# Patient Record
Sex: Female | Born: 1965 | Race: White | Hispanic: No | Marital: Single | Smoking: Light tobacco smoker
Health system: Southern US, Community
[De-identification: ages and names within clinical notes are randomized; demographics above are authoritative.]

## PROBLEM LIST (undated history)

## (undated) DIAGNOSIS — I639 Cerebral infarction, unspecified: Secondary | ICD-10-CM

## (undated) DIAGNOSIS — I10 Essential (primary) hypertension: Secondary | ICD-10-CM

---

## 1998-12-01 ENCOUNTER — Emergency Department (HOSPITAL_COMMUNITY): Admission: EM | Admit: 1998-12-01 | Discharge: 1998-12-01 | Payer: Self-pay | Admitting: Emergency Medicine

## 1998-12-02 ENCOUNTER — Inpatient Hospital Stay (HOSPITAL_COMMUNITY): Admission: EM | Admit: 1998-12-02 | Discharge: 1998-12-05 | Payer: Self-pay | Admitting: Emergency Medicine

## 2005-02-26 ENCOUNTER — Emergency Department (HOSPITAL_COMMUNITY): Admission: EM | Admit: 2005-02-26 | Discharge: 2005-02-26 | Payer: Self-pay | Admitting: Emergency Medicine

## 2005-02-28 ENCOUNTER — Emergency Department (HOSPITAL_COMMUNITY): Admission: EM | Admit: 2005-02-28 | Discharge: 2005-02-28 | Payer: Self-pay | Admitting: Emergency Medicine

## 2006-06-22 ENCOUNTER — Emergency Department (HOSPITAL_COMMUNITY): Admission: EM | Admit: 2006-06-22 | Discharge: 2006-06-22 | Payer: Self-pay | Admitting: Emergency Medicine

## 2007-02-24 ENCOUNTER — Emergency Department (HOSPITAL_COMMUNITY): Admission: EM | Admit: 2007-02-24 | Discharge: 2007-02-25 | Payer: Self-pay | Admitting: Emergency Medicine

## 2008-05-30 ENCOUNTER — Ambulatory Visit: Payer: Self-pay | Admitting: Gastroenterology

## 2008-06-14 ENCOUNTER — Ambulatory Visit (HOSPITAL_BASED_OUTPATIENT_CLINIC_OR_DEPARTMENT_OTHER): Admission: RE | Admit: 2008-06-14 | Discharge: 2008-06-14 | Payer: Self-pay | Admitting: Orthopedic Surgery

## 2008-07-08 ENCOUNTER — Ambulatory Visit (HOSPITAL_BASED_OUTPATIENT_CLINIC_OR_DEPARTMENT_OTHER): Admission: RE | Admit: 2008-07-08 | Discharge: 2008-07-08 | Payer: Self-pay | Admitting: Orthopedic Surgery

## 2010-07-10 NOTE — Op Note (Signed)
[name], Simpson              ACCOUNT NO.:  [id number]   MEDICAL RECORD NO.:  [id number]          PATIENT TYPE:  AMB   LOCATION:  DSC                          FACILITY:  [facility]   PHYSICIAN:  Katy Fitch. Sypher, M.D. DATE OF BIRTH:  1965-09-16   DATE OF PROCEDURE:  06/14/2008  DATE OF DISCHARGE:                               OPERATIVE REPORT   PREOPERATIVE DIAGNOSES:  1. 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  2. 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   POSTOPERATIVE DIAGNOSES:  1. 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  2. 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   OPERATIONS:  1. Release of left transverse carpal ligament.  2. Decompression of canal of Guyon by release of pisohamate ligament      and release of fascia deep to palmaris brevis muscle with      neurolysis of ulnar nerve across the canal of Guyon.   OPERATING SURGEON:  Katy Fitch. Sypher, MD   ASSISTANT:  None.   ANESTHESIA:  General by LMA.   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 Evelene Croon of [facility] for  evaluation and management of hand numbness.  Clinical examination by Dr.  Lacie Scotts revealed probable carpal tunnel syndrome.  Electrodiagnostic  studies were completed by a neurologist in [place], revealing  evidence of bilateral carpal tunnel syndrome, left greater than right  and left ulnar neuropathy at the level of the wrist.  In addition, [name] had symptoms of numbness proximally in the arm.  She was  evaluated with a cervical MRI.  The cervical MRI did not reveal any  evidence of cord or significant root compression.   Heather Simpson was referred for hand surgery consult due to a failure to  respond to nonoperative measures.  After informed consent at our office,  she is brought to the operating at this time anticipating  decompression  of her left median nerve at the carpal canal and her left ulnar nerve to  the canal of Guyon.   She understands that this will be performed through two incision  technique with release of the transverse carpal ligament and  decompression of the fascial structures surrounding the ulnar nerve.  After informed consent, she is brought to the operating room at this  time.   PROCEDURE IN DETAIL:  Heather Simpson is brought to the operating room  and placed in supine position upon the operating table.   Following the induction of general anesthesia under Dr. Marlane Mingle  direct supervision, the left arm was prepped with Betadine soap and  solution and sterilely draped.  A pneumatic tourniquet was applied to  the proximal left brachium.   Following exsanguination of left arm with Esmarch bandage, arterial  tourniquet was inflated to 230 mmHg.  The procedure commenced with a  short incision in the line of the ring finger palm.  Subcutaneous  tissues were carefully divided revealing the palmar fascia.  This was  carefully separated from the  canal of Guyon and the palmar fascia  overlying the nerve released to allow visualization of the common  sensory branches to the level of the transverse carpal ligament.  The  median nerve proper was separated from the transverse carpal ligament  with a Penfield 4 elevator followed by release of the transverse carpal  ligament with scissors extending into the distal forearm.  This widely  opened the carpal canal.  No masses or predicaments were noted.  Bleeding points were electrocauterized with bipolar current.   The fascia deep to the palmaris brevis muscle was then released with  careful retraction of the muscle.  Care was taken to preserve all  sensory branches of the palm.  The ulnar artery and the sensory branch  of the ulnar nerve root were decompressed proximally followed by a  second incision at the volar aspect of the wrist that  allowed exposure  of the flexor carpi ulnaris.  The fascia along the radial border of  flexor carpi ulnaris was decompressed allowing visualization of the  ulnar nerve and artery.  The fascia overlying the ulnar artery and nerve  was released extending through the first, second, and third zones of the  canal of Guyon.  Care was taken to directly visualize the nerve during  decompression.   Bleeding points were examined and electrocauterized.   Ultimately, a Penfield 4 elevator was passed along the canal of Guyon  assuring complete decompression.  There were no apparent complications.  No masses or osteophytes on the pisotriquetral joint were identified.   The wounds were then repaired with intradermal 3-0 Prolene.  Lidocaine  2% was infiltrated with postop analgesia.  Heather Simpson was placed in  compressive dressing with volar plaster splint maintaining the wrist in  5 degrees of dorsiflexion.   For aftercare, she is provided a prescription for Percocet 5 mg.  She is  advised to take one p.o. q.4-6 h. p.r.n. pain.  She is provided 24  tablets without refill.      Katy Fitch Sypher, M.D.  Electronically Signed     RVS/MEDQ  D:  06/14/2008  T:  06/14/2008  Job:  [id number]   cc:   Evelene Croon, M.D.

## 2010-07-10 NOTE — Op Note (Signed)
NAMESONNET, RIZOR              ACCOUNT NO.:  [id number]   MEDICAL RECORD NO.:  [id number]          PATIENT TYPE:  AMB   LOCATION:  DSC                          FACILITY:  [facility]   PHYSICIAN:  Katy Fitch. Sypher, M.D. DATE OF BIRTH:  Jul 03, 1965   DATE OF PROCEDURE:  07/08/2008  DATE OF DISCHARGE:                               OPERATIVE REPORT   PREOPERATIVE DIAGNOSIS:  Chronic entrapped neuropathy, right median  nerve, right carpal tunnel.   POSTOPERATIVE DIAGNOSIS:  Chronic entrapped neuropathy, right median  nerve, right carpal tunnel.   OPERATIONS:  Release of right transverse carpal ligament.   OPERATING SURGEON:  Katy Fitch. Sypher, MD   ASSISTANT:  Marveen Reeks Dasnoit, PA-C   ANESTHESIA:  General by LMA.   SUPERVISING ANESTHESIOLOGIST:  Dr. Jean Rosenthal.   INDICATIONS:  Heather Simpson is a 45 year old woman referred through  the courtesy of Dr. Evelene Croon of [place], [place] for  evaluation and management of hand numbness.  She was noted to have  moderately severe bilateral carpal tunnel syndrome.  Electrodiagnostic  studies confirmed bilateral median neuropathy.   She is status post release of her left transverse carpal ligament with a  very satisfactory outcome.  She now returns for identical surgery on the  right.   After an informed consent, she is brought to the operating room at this  time.   PROCEDURE:  [name] was brought to the operating room and placed  in supine position upon the operating table.   Following the induction of general anesthesia by LMA technique, the  right arm was prepped with Betadine soap and solution and sterilely  draped.  A pneumatic tourniquet was applied to the proximal right  brachium.   On exsanguination of the right arm with Esmarch bandage, arterial  tourniquet was inflated to 200 and 30 mmHg.  The procedure commenced  with a short incision in the line of the ring finger and the palm.  Subcutaneous tissues were  carefully divided revealing the palmar fascia.  This split longitudinally to the common sensory branch of the median  nerve.  These were followed back to the median nerve proper.  Median  nerve was then gently isolated from the transverse carpal ligament.  The  ligament was released with scissors along its ulnar border extending  into the distal forearm.   This widely opened the carpal canal.  No mass or other pigments were  noted.  Bleeding was not problematic.  The wound was then repaired with  intradermal 3-0 Prolene.  A compressive dressing was applied with a  volar plaster splint maintaining the wrist in 5 degrees of dorsiflexion.   For aftercare, [name] is advised to elevate her hand.  To work on  finger range of motion exercises.  We will see her back in followup in 7  days for dressing change and suture removal.       Katy Fitch. Sypher, M.D.  Electronically Signed     RVS/MEDQ  D:  07/08/2008  T:  07/08/2008  Job:  [id number]   cc:   Evelene Croon

## 2013-02-25 HISTORY — PX: BREAST BIOPSY: SHX20

## 2016-05-21 ENCOUNTER — Encounter: Payer: Self-pay | Admitting: Emergency Medicine

## 2016-05-21 DIAGNOSIS — F10129 Alcohol abuse with intoxication, unspecified: Secondary | ICD-10-CM | POA: Insufficient documentation

## 2016-05-21 NOTE — ED Triage Notes (Signed)
Pt was at homeless shelter and they told she smelled like alcohol and needed to come here for detox. Pt is from [place] visiting someone and doesn't have a place to go. Pt denies any drug use, has had 4 beer and tequila tonight.

## 2016-05-22 ENCOUNTER — Emergency Department
Admission: EM | Admit: 2016-05-22 | Discharge: 2016-05-22 | Disposition: A | Discharge status: Home / Self Care | Payer: Self-pay | Attending: Emergency Medicine | Admitting: Emergency Medicine

## 2016-05-22 DIAGNOSIS — F10920 Alcohol use, unspecified with intoxication, uncomplicated: Secondary | ICD-10-CM

## 2016-05-22 NOTE — ED Provider Notes (Signed)
[facility] Emergency Department Provider Note _   First MD Initiated Contact with Patient 05/22/16 0330     (approximate)  I have reviewed the triage vital signs and the nursing notes.  History Limited secondary to alcohol intoxication HISTORY  Chief Complaint Addiction Problem   HPI Heather Simpson is a 51 y.o. female presents stating that she was not allowed into the homeless shelter secondary to alcohol intoxication and as such did not have anywhere to go. Patient admits to drinking multiple beers and tequila tonight. Patient denies any suicidal homicidal ideation.   Past medical history None There are no active problems to display for this patient.   Past surgical history None  Prior to Admission medications   Not on File    Allergies No known drug allergies No family history on file.  Social History Social History  Substance Use Topics  . Smoking status: Not on file  . Smokeless tobacco: Not on file  . Alcohol use Not on file    Review of Systems Constitutional: No fever/chills Eyes: No visual changes. ENT: No sore throat. Cardiovascular: Denies chest pain. Respiratory: Denies shortness of breath. Gastrointestinal: No abdominal pain.  No nausea, no vomiting.  No diarrhea.  No constipation. Genitourinary: Negative for dysuria. Musculoskeletal: Negative for back pain. Skin: Negative for rash. Neurological: Negative for headaches, focal weakness or numbness.  10-point ROS otherwise negative.  ____________________________________________   PHYSICAL EXAM:  VITAL SIGNS: ED Triage Vitals  Enc Vitals Group     BP 05/21/16 2324 127/81     Pulse Rate 05/21/16 2324 97     Resp 05/21/16 2324 18     Temp 05/21/16 2324 97.9 F (36.6 C)     Temp Source 05/21/16 2324 Oral     SpO2 05/21/16 2324 100 %     Weight 05/21/16 2324 130 lb (59 kg)     Height 05/21/16 2324 5\' 2"  (1.575 m)     Head Circumference --      Peak Flow --        Pain Score 05/22/16 0357 0     Pain Loc --      Pain Edu? --      Excl. in [place]? --     Constitutional: Alert and oriented. Appears intoxicated Eyes: Conjunctivae are normal. PERRL. EOMI. Head: Atraumatic. Mouth/Throat: Mucous membranes are moist.  Oropharynx non-erythematous. Neck: No stridor.   Cardiovascular: Normal rate, regular rhythm. Good peripheral circulation. Grossly normal heart sounds. Respiratory: Normal respiratory effort.  No retractions. Lungs CTAB. Gastrointestinal: Soft and nontender. No distention.   Musculoskeletal: No lower extremity tenderness nor edema. No gross deformities of extremities. Neurologic:  Normal speech and language. No gross focal neurologic deficits are appreciated.  Skin:  Skin is warm, dry and intact. No rash noted.    Procedures   ____________________________________________   INITIAL IMPRESSION / ASSESSMENT AND PLAN / ED COURSE  Pertinent labs & imaging results that were available during my care of the patient were reviewed by me and considered in my medical decision making (see chart for details).        ____________________________________________  FINAL CLINICAL IMPRESSION(S) / ED DIAGNOSES  Final diagnoses:  Alcoholic intoxication without complication ([place])     MEDICATIONS GIVEN DURING THIS VISIT:  Medications - No data to display   NEW OUTPATIENT MEDICATIONS STARTED DURING THIS VISIT:  New Prescriptions   No medications on file    Modified Medications   No medications on file  Discontinued Medications   No medications on file     Note:  This document was prepared using Dragon voice recognition software and may include unintentional dictation errors.    Gregor Hams, MD 05/22/16 [phone]

## 2016-05-22 NOTE — ED Notes (Addendum)
Pt denies any pain or medical c/o at this time. Dr Owens Shark at bedside; pt informed him that she was told she could not stay at the homeless shelter tonight d/t ETOH intoxication and needed a place to stay. Per Dr Owens Shark, ok to stay in [facility] until needed as ED census dictates.

## 2016-05-22 NOTE — ED Notes (Addendum)
Pt discharged home after verbalizing understanding of discharge instructions; nad noted.  Pt given food tray and bus pass. Denies any needs beyond these.

## 2016-07-19 ENCOUNTER — Emergency Department
Admission: EM | Admit: 2016-07-19 | Discharge: 2016-07-19 | Disposition: A | Discharge status: Home / Self Care | Payer: Self-pay | Attending: Emergency Medicine | Admitting: Emergency Medicine

## 2016-07-19 ENCOUNTER — Emergency Department: Payer: Self-pay

## 2016-07-19 DIAGNOSIS — R202 Paresthesia of skin: Secondary | ICD-10-CM | POA: Insufficient documentation

## 2016-07-19 DIAGNOSIS — Z8673 Personal history of transient ischemic attack (TIA), and cerebral infarction without residual deficits: Secondary | ICD-10-CM | POA: Insufficient documentation

## 2016-07-19 LAB — TROPONIN I: Troponin I: <0.03 ng/mL (ref <0.03)

## 2016-07-19 LAB — COMPREHENSIVE METABOLIC PANEL
ALBUMIN: 3.8 g/dL (ref 3.5–5.0)
ALK PHOS: 47 U/L (ref 38–126)
ALT: 14 U/L (ref 14–54)
ANION GAP: 4 — AB (ref 5–15)
AST: 18 U/L (ref 15–41)
BUN: 6 mg/dL (ref 6–20)
CHLORIDE: 113 mmol/L — AB (ref 101–111)
CO2: 23 mmol/L (ref 22–32)
CREATININE: 0.57 mg/dL (ref 0.44–1.00)
Calcium: 8.7 mg/dL — ABNORMAL LOW (ref 8.9–10.3)
GFR calc non Af Amer: >60 mL/min (ref >60)
GLUCOSE: 85 mg/dL (ref 65–99)
Potassium: 3.4 mmol/L — ABNORMAL LOW (ref 3.5–5.1)
SODIUM: 140 mmol/L (ref 135–145)
Total Bilirubin: 0.2 mg/dL — ABNORMAL LOW (ref 0.3–1.2)
Total Protein: 7.2 g/dL (ref 6.5–8.1)

## 2016-07-19 LAB — CBC
HCT: 40.5 % (ref 35.0–47.0)
HEMOGLOBIN: 14.1 g/dL (ref 12.0–16.0)
MCH: 33.8 pg (ref 26.0–34.0)
MCHC: 34.9 g/dL (ref 32.0–36.0)
MCV: 96.7 fL (ref 80.0–100.0)
PLATELETS: 298 10*3/uL (ref 150–440)
RBC: 4.19 MIL/uL (ref 3.80–5.20)
RDW: 14 % (ref 11.5–14.5)
WBC: 10.2 10*3/uL (ref 3.6–11.0)

## 2016-07-19 NOTE — ED Triage Notes (Addendum)
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 States here because has never had symptoms to right side. Pt also co left sided neck pain but states has had symptoms for years.

## 2016-07-19 NOTE — ED Provider Notes (Signed)
[facility] Emergency Department Provider Note  Time seen: 5:41 AM  I have reviewed the triage vital signs and the nursing notes.   HISTORY  Chief Complaint Tingling    HPI Heather Simpson is a 51 y.o. female with a past medical history of TIAs who presents the emergency department with right-sided tingling. According to the patient for the past several days she has been experiencing a tingling sensation at times in her leg, at times in her right flank and at times in her right arm. Patient has a history of TIAs in the past although she states they mostly affected the left side. Denies any tingling currently. Denies any weakness or numbness at any point. Denies any confusion, slurred speech or headache. Also states a history of peripheral neuropathy but also states that typically affects her left side.  No past medical history on file.  There are no active problems to display for this patient.   No past surgical history on file.  Prior to Admission medications   Not on File    No Known Allergies  No family history on file.  Social History Social History  Substance Use Topics  . Smoking status: Not on file  . Smokeless tobacco: Not on file  . Alcohol use Not on file    Review of Systems Constitutional: Negative for fever. Cardiovascular: Negative for chest pain. Respiratory: Negative for shortness of breath. Gastrointestinal: Negative for abdominal pain Genitourinary: Negative for dysuria. Musculoskeletal: Mild neck pain. Skin: Negative for rash. Neurological: Negative for headache. Negative for weakness or numbness. Positive for tingling over various parts of the right side of her body over the last few days. All other ROS negative  ____________________________________________   PHYSICAL EXAM:  VITAL SIGNS: ED Triage Vitals  Enc Vitals Group     BP 07/19/16 0210 (!) 115/95     Pulse Rate 07/19/16 0210 99     Resp 07/19/16 0210 16     Temp 07/19/16 0210 98.7 F (37.1 C)     Temp Source 07/19/16 0210 Oral     SpO2 07/19/16 0210 98 %     Weight 07/19/16 0211 130 lb (59 kg)     Height 07/19/16 0211 5\' 2"  (1.575 m)     Head Circumference --      Peak Flow --      Pain Score 07/19/16 0215 8     Pain Loc --      Pain Edu? --      Excl. in [place]? --     Constitutional: Alert and oriented. Well appearing and in no distress. Eyes: Normal exam ENT   Head: Normocephalic and atraumatic.   Mouth/Throat: Mucous membranes are moist. Cardiovascular: Normal rate, regular rhythm. No murmur Respiratory: Normal respiratory effort without tachypnea nor retractions. Breath sounds are clear  Gastrointestinal: Soft and nontender. No distention.   Musculoskeletal: Nontender with normal range of motion in all extremities.  Neurologic:  Normal speech and language. No gross focal neurologic deficits. Equal grip strength. No pronator drift. 5/5 motor in all extremities. No cranial nerve deficits. Skin:  Skin is warm, dry and intact.  Psychiatric: Mood and affect are normal.   ____________________________________________    EKG  EKG reviewed and interpreted by myself shows normal sinus rhythm at 89 bpm, narrow QRS, normal axis, normal intervals, no ST changes. Normal EKG.  ____________________________________________    RADIOLOGY  CT head negative  ____________________________________________   INITIAL IMPRESSION / ASSESSMENT AND PLAN / ED  COURSE  Pertinent labs & imaging results that were available during my care of the patient were reviewed by me and considered in my medical decision making (see chart for details).  Patient presents to the emergency department for intermittent paresthesias of the right side of her body over the past several days. Currently the patient appears well denies any paresthesias or tingling at this time. She has a normal exam including neurological exam. Patient's lab work is reassuring. EKG is  normal. CT head is normal. As the patient is asymptomatic/symptom free at this time, I believe the patient is safe for discharge home with PCP follow-up. The patient is agreeable to this plan.  ____________________________________________   FINAL CLINICAL IMPRESSION(S) / ED DIAGNOSES  Paresthesias    Harvest Dark, MD 07/19/16 [phone]

## 2016-07-19 NOTE — ED Notes (Signed)

## 2017-06-03 ENCOUNTER — Other Ambulatory Visit: Payer: Self-pay | Admitting: Family Medicine

## 2017-06-03 DIAGNOSIS — Z1231 Encounter for screening mammogram for malignant neoplasm of breast: Secondary | ICD-10-CM

## 2017-07-11 ENCOUNTER — Ambulatory Visit
Admission: RE | Admit: 2017-07-11 | Discharge: 2017-07-11 | Disposition: A | Discharge status: Home / Self Care | Payer: BLUE CROSS/BLUE SHIELD | Source: Ambulatory Visit | Attending: Family Medicine | Admitting: Family Medicine

## 2017-07-11 DIAGNOSIS — Z1231 Encounter for screening mammogram for malignant neoplasm of breast: Secondary | ICD-10-CM | POA: Insufficient documentation

## 2017-07-17 ENCOUNTER — Other Ambulatory Visit: Payer: Self-pay | Admitting: *Deleted

## 2017-07-17 ENCOUNTER — Inpatient Hospital Stay
Admission: RE | Admit: 2017-07-17 | Discharge: 2017-07-17 | Disposition: A | Discharge status: Home / Self Care | Payer: Self-pay | Source: Ambulatory Visit | Attending: *Deleted | Admitting: *Deleted

## 2017-07-17 DIAGNOSIS — Z9289 Personal history of other medical treatment: Secondary | ICD-10-CM

## 2017-07-24 ENCOUNTER — Other Ambulatory Visit: Payer: Self-pay | Admitting: Family Medicine

## 2017-07-24 DIAGNOSIS — N6310 Unspecified lump in the right breast, unspecified quadrant: Secondary | ICD-10-CM

## 2017-07-24 DIAGNOSIS — R928 Other abnormal and inconclusive findings on diagnostic imaging of breast: Secondary | ICD-10-CM

## 2017-07-31 ENCOUNTER — Ambulatory Visit
Admission: RE | Admit: 2017-07-31 | Discharge: 2017-07-31 | Disposition: A | Discharge status: Home / Self Care | Payer: BLUE CROSS/BLUE SHIELD | Source: Ambulatory Visit | Attending: Family Medicine | Admitting: Family Medicine

## 2017-07-31 DIAGNOSIS — N6310 Unspecified lump in the right breast, unspecified quadrant: Secondary | ICD-10-CM

## 2017-07-31 DIAGNOSIS — R928 Other abnormal and inconclusive findings on diagnostic imaging of breast: Secondary | ICD-10-CM

## 2018-06-03 ENCOUNTER — Emergency Department: Payer: BLUE CROSS/BLUE SHIELD

## 2018-06-03 ENCOUNTER — Other Ambulatory Visit: Payer: Self-pay

## 2018-06-03 ENCOUNTER — Encounter: Payer: Self-pay | Admitting: Emergency Medicine

## 2018-06-03 ENCOUNTER — Emergency Department
Admission: EM | Admit: 2018-06-03 | Discharge: 2018-06-04 | Disposition: A | Discharge status: Home / Self Care | Payer: BLUE CROSS/BLUE SHIELD | Attending: Emergency Medicine | Admitting: Emergency Medicine

## 2018-06-03 DIAGNOSIS — Z8673 Personal history of transient ischemic attack (TIA), and cerebral infarction without residual deficits: Secondary | ICD-10-CM | POA: Diagnosis not present

## 2018-06-03 DIAGNOSIS — R5383 Other fatigue: Secondary | ICD-10-CM | POA: Diagnosis not present

## 2018-06-03 DIAGNOSIS — R51 Headache: Secondary | ICD-10-CM | POA: Insufficient documentation

## 2018-06-03 DIAGNOSIS — R531 Weakness: Secondary | ICD-10-CM | POA: Diagnosis not present

## 2018-06-03 LAB — COMPREHENSIVE METABOLIC PANEL
ALT: 28 U/L (ref 0–44)
AST: 25 U/L (ref 15–41)
Albumin: 4.1 g/dL (ref 3.5–5.0)
Alkaline Phosphatase: 54 U/L (ref 38–126)
Anion gap: 10 (ref 5–15)
BUN: 15 mg/dL (ref 6–20)
CO2: 26 mmol/L (ref 22–32)
Calcium: 9.4 mg/dL (ref 8.9–10.3)
Chloride: 104 mmol/L (ref 98–111)
Creatinine, Ser: 0.59 mg/dL (ref 0.44–1.00)
GFR calc Af Amer: >60 mL/min (ref >60)
GFR calc non Af Amer: >60 mL/min (ref >60)
Glucose, Bld: 93 mg/dL (ref 70–99)
Potassium: 3.7 mmol/L (ref 3.5–5.1)
Sodium: 140 mmol/L (ref 135–145)
Total Bilirubin: 0.4 mg/dL (ref 0.3–1.2)
Total Protein: 7 g/dL (ref 6.5–8.1)

## 2018-06-03 LAB — URINE DRUG SCREEN, QUALITATIVE (ARMC ONLY)
Amphetamines, Ur Screen: NOT DETECTED
Barbiturates, Ur Screen: NOT DETECTED
Benzodiazepine, Ur Scrn: NOT DETECTED
Cannabinoid 50 Ng, Ur ~~LOC~~: NOT DETECTED
Cocaine Metabolite,Ur ~~LOC~~: NOT DETECTED
MDMA (Ecstasy)Ur Screen: NOT DETECTED
Methadone Scn, Ur: NOT DETECTED
Opiate, Ur Screen: NOT DETECTED
Phencyclidine (PCP) Ur S: NOT DETECTED
Tricyclic, Ur Screen: NOT DETECTED

## 2018-06-03 LAB — DIFFERENTIAL
Abs Immature Granulocytes: 0.04 10*3/uL (ref 0.00–0.07)
Basophils Absolute: 0.1 10*3/uL (ref 0.0–0.1)
Basophils Relative: 1 %
Eosinophils Absolute: 0.3 10*3/uL (ref 0.0–0.5)
Eosinophils Relative: 3 %
Immature Granulocytes: 0 %
Lymphocytes Relative: 28 %
Lymphs Abs: 2.6 10*3/uL (ref 0.7–4.0)
Monocytes Absolute: 1 10*3/uL (ref 0.1–1.0)
Monocytes Relative: 10 %
Neutro Abs: 5.5 10*3/uL (ref 1.7–7.7)
Neutrophils Relative %: 58 %

## 2018-06-03 LAB — CBC
HCT: 41 % (ref 36.0–46.0)
Hemoglobin: 13.7 g/dL (ref 12.0–15.0)
MCH: 33.3 pg (ref 26.0–34.0)
MCHC: 33.4 g/dL (ref 30.0–36.0)
MCV: 99.5 fL (ref 80.0–100.0)
Platelets: 262 10*3/uL (ref 150–400)
RBC: 4.12 MIL/uL (ref 3.87–5.11)
RDW: 13 % (ref 11.5–15.5)
WBC: 9.5 10*3/uL (ref 4.0–10.5)
nRBC: 0 % (ref 0.0–0.2)

## 2018-06-03 LAB — PROTIME-INR
INR: 1 (ref 0.8–1.2)
Prothrombin Time: 13.3 seconds (ref 11.4–15.2)

## 2018-06-03 LAB — APTT: aPTT: 30 seconds (ref 24–36)

## 2018-06-03 LAB — URINALYSIS, ROUTINE W REFLEX MICROSCOPIC
Bilirubin Urine: NEGATIVE
Glucose, UA: NEGATIVE mg/dL
Hgb urine dipstick: NEGATIVE
Ketones, ur: NEGATIVE mg/dL
Leukocytes,Ua: NEGATIVE
Nitrite: NEGATIVE
Protein, ur: NEGATIVE mg/dL
Specific Gravity, Urine: 1.013 (ref 1.005–1.030)
pH: 6 (ref 5.0–8.0)

## 2018-06-03 MED ORDER — FENTANYL CITRATE (PF) 100 MCG/2ML IJ SOLN
12.5000 ug | Freq: Once | INTRAMUSCULAR | Status: AC
  Administered 2018-06-03: 12.5 ug via INTRAVENOUS
  Filled 2018-06-03: qty 2

## 2018-06-03 NOTE — Consult Note (Signed)
   [facility] Tele[facility]urology Consult Services  Impression:  Patient with extensive workup at [facility] as per notes in [place] - work up negative.  Given negative work up and her exam today, suspect a functional process - speech is stuttering/nonroganic and LUE drifts without pronation.  HA can be treated conservatively, as the patient is in no distress and CT today shows nothing acute.  Recommendations:  - conservative HA treatment as per ED - follow up outpatient neurology and [facility] - sign off ---------------------------------------------------------------------  CC: HA  History of Present Illness:  53 year old woman with a history of depression, anxiety, smoking.  Admitted to [facility] on 4/1 with left-sided weakness/sensory loss and speech change.  Given tPA.  Admitted and MRI showed no acute changes and MRA head/neck showed patent vessels.  Patient continued with symptoms of left-sided weakness/numbness, but speech improved.  Was getting [place] therapy since discharge on 4/3.  Notes from [facility] indicate patient likely with functional weakness - Todd's phenomenon considered, but EEG while patient symptomatic was normal.  She reports to the ED with left-sided headache for 2 days.  Speech has been stuttering, but patient unable to reliably tell when this reappeared.  Diagnostic Testing: CT head wo - nothing acute  Vital Signs:   Reviewed in EMR  Exam:  Mental Status:  Awake, alert, oriented.  Intermittent stuttering.  Cranial Nerves:  Pupils: Equal round and reactive to light Extraocular movements: Intact in all cardinal gaze Ptosis: Absent Visual fields: Intact to finger counting Facial sensation: reduced on left Facial movements: Intact and symmetric    Motor Exam:  Drift of LUE without pronation, giveway weakness LLE on sustention   Tremor/Abnormal Movements:  Resting tremor: Absent Intention tremor: Absent Postural tremor: Absent  Sensory Exam:   Decreased LT/PP in left  arm/leg    Coordination:   Finger to nose: Intact Heel to shin: Intact  Medical Decision Making:  - Extensive number of diagnosis or management options are considered above.   - Extensive amount of complex data reviewed.   - High risk of complication and/or morbidity or mortality are associated with differential diagnostic considerations above.  - There may be uncertain outcome and increased probability of prolonged functional impairment or high probability of severe prolonged functional impairment associated with some of these differential diagnosis.   Medical Data Reviewed:  1.Data reviewed include clinical labs, radiology,  Medical Tests;   2.Tests results discussed w/performing or interpreting physician;   3.Obtaining/reviewing old medical records;  4.Obtaining case history from another source;  5.Independent review of image, tracing or specimen.    Patient was informed the Neurology Consult would happen via telehealth (remote video) and consented to receiving care in this manner.

## 2018-06-03 NOTE — Discharge Instructions (Addendum)
Take your medicines daily as directed by your doctor.  Return to the ER for worsening symptoms, persistent vomiting, difficulty breathing or other concerns. 

## 2018-06-03 NOTE — ED Notes (Signed)
Pt returned from CT °

## 2018-06-03 NOTE — ED Notes (Signed)
Dr. Jacqualine Code clarified that he did not want to make the Pt a code stroke, he wants the Pt to have a Neurology consult.

## 2018-06-03 NOTE — ED Notes (Signed)
Pt went to CT

## 2018-06-03 NOTE — ED Triage Notes (Signed)
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 Pt reports that today is her best day up to date following the stroke. Pt states that she occasionally feels a temporary headache accompanied by jerking and a cold sensation on the legs. Pt is AOx4 in no apparent distress.

## 2018-06-03 NOTE — ED Provider Notes (Signed)
[facility] Emergency Department Provider Note   ____________________________________________   First MD Initiated Contact with Patient 06/03/18 1959     (approximate)  I have reviewed the triage vital signs and the nursing notes.   HISTORY  Chief Complaint Altered Mental Status    HPI Heather Simpson is a 53 y.o. female   recently evaluated and treated with TPA for possible stroke at [facility] 1 week ago.  Patient reports that about 4 hours prior to arrival she started notice a bit of a slight headache over the left side is bit throbbing in nature.  No eye pain.  She also notes that today she felt a little bit more weak in her left lower leg but also reports she did some intense physical therapy today.  Physical therapy is seeing and evaluating her in her home daily as she is now in recovery after receiving TPA at [facility] for a possible stroke.  She is on salicylates daily at this time  History reviewed. No pertinent past medical history.  There are no active problems to display for this patient.   Past Surgical History:  Procedure Laterality Date   BREAST BIOPSY Left 2015   benign    Prior to Admission medications   Not on File    Allergies Patient has no known allergies.  Family History  Problem Relation Age of Onset   Breast cancer Neg Hx     Social History Social History   Tobacco Use   Smoking status: Never Smoker   Smokeless tobacco: Never Used  Substance Use Topics   Alcohol use: Never    Frequency: Never   Drug use: Never    Review of Systems Constitutional: No fever/chills but feeling fatigued Eyes: No visual changes. ENT: No sore throat. Cardiovascular: Denies chest pain. Respiratory: Denies shortness of breath. Gastrointestinal: No abdominal pain.   Genitourinary: Negative for dysuria. Musculoskeletal: Negative for back pain. Skin: Negative for rash. Neurological: Negative for slight headache, see HPI.   Some slight weakness noted in the left leg a little more than baseline from her recent stroke and also reports some trouble speaking and is been ongoing since the time of her stroke    ____________________________________________   PHYSICAL EXAM:  VITAL SIGNS: ED Triage Vitals  Enc Vitals Group     BP 06/03/18 2012 (!) 136/98     Pulse Rate 06/03/18 2012 66     Resp 06/03/18 2012 16     Temp 06/03/18 2012 98.3 F (36.8 C)     Temp Source 06/03/18 2012 Oral     SpO2 06/03/18 2012 99 %     Weight 06/03/18 2015 132 lb (59.9 kg)     Height 06/03/18 2015 5\' 2"  (1.575 m)     Head Circumference --      Peak Flow --      Pain Score 06/03/18 2014 7     Pain Loc --      Pain Edu? --      Excl. in [place]? --     Constitutional: Alert and oriented. Well appearing and in no acute distress.  She is very pleasant. Eyes: Conjunctivae are normal. Head: Atraumatic. Nose: No congestion/rhinnorhea. Mouth/Throat: Mucous membranes are moist. Neck: No stridor.  Cardiovascular: Normal rate, regular rhythm. Grossly normal heart sounds.  Good peripheral circulation. Respiratory: Normal respiratory effort.  No retractions. Lungs CTAB. Gastrointestinal: Soft and nontender. No distention. Musculoskeletal: No lower extremity tenderness nor edema. Neurologic:  Normal speech  and language at times, then sometimes seems to have like a slight slurring of her language. No gross focal neurologic deficits are appreciated except for about 3-5 strength left leg left arm.  She is able to smile with symmetry and opens and closes her left eye symmetrically.  There is no right-sided ataxia.  Some slight difficulty with use of the left arm and left leg, but overall able to resist and lift above gravity in both extremities. Skin:  Skin is warm, dry and intact. No rash noted. Psychiatric: Mood and affect are normal. Speech and behavior are normal.  ____________________________________________   LABS (all labs ordered are  listed, but only abnormal results are displayed)  Labs Reviewed  URINALYSIS, ROUTINE W REFLEX MICROSCOPIC - Abnormal; Notable for the following components:      Result Value   Color, Urine YELLOW (*)    APPearance HAZY (*)    All other components within normal limits  PROTIME-INR  APTT  CBC  DIFFERENTIAL  COMPREHENSIVE METABOLIC PANEL  URINE DRUG SCREEN, QUALITATIVE ([facility] ONLY)   ____________________________________________  EKG  Reviewed entered by me at 2015 Heart rate 65 QRS 90 QTc 470 Normal sinus rhythm, no evidence of ischemia or ectopy.  No A. fib. ____________________________________________  RADIOLOGY  Ct Head Code Stroke Wo Contrast  Result Date: 06/03/2018 CLINICAL DATA:  Code stroke. Initial evaluation for acute altered mental status, headache, recent stroke. EXAM: CT HEAD WITHOUT CONTRAST TECHNIQUE: Contiguous axial images were obtained from the base of the skull through the vertex without intravenous contrast. COMPARISON:  Prior CT from 07/19/2016. FINDINGS: Brain: Cerebral volume within normal limits for patient age. No evidence for acute intracranial hemorrhage. No findings to suggest acute large vessel territory infarct. No mass lesion, midline shift, or mass effect. Ventricles are normal in size without evidence for hydrocephalus. No extra-axial fluid collection identified. Vascular: No hyperdense vessel identified. Skull: Scalp soft tissues demonstrate no acute abnormality. Calvarium intact. Sinuses/Orbits: Globes and orbital soft tissues within normal limits. Visualized paranasal sinuses are clear. No mastoid effusion. ASPECTS [facility] Stroke Program Early CT Score) - Ganglionic level infarction (caudate, lentiform nuclei, internal capsule, insula, M1-M3 cortex): 7 - Supraganglionic infarction (M4-M6 cortex): 3 Total score (0-10 with 10 being normal): 10 IMPRESSION: 1. Negative head CT. No acute intracranial infarct or other abnormality. 2. ASPECTS is 10. Critical  Value/emergent results were called by telephone at the time of interpretation on 06/03/2018 at 8:41 pm to Dr. Delman Kitten , who verbally acknowledged these results. Electronically Signed   By: Jeannine Boga M.D.   On: 06/03/2018 20:44   Head CT negative for acute.  No chest symptoms. ____________________________________________   PROCEDURES  Procedure(s) performed: None  Procedures  Critical Care performed: No  ____________________________________________   INITIAL IMPRESSION / ASSESSMENT AND PLAN / ED COURSE  Pertinent labs & imaging results that were available during my care of the patient were reviewed by me and considered in my medical decision making (see chart for details).   Patient presents for mild left-sided headache, also in the setting of fatigue she reports a little bit of increased fatigue over the left side but also reports doing physical therapy today and feeling very tired when her friends came over as she had had physical therapy.  She reports a chronic weakness over the left side after being diagnosed with or evaluated for possible stroke at [facility] for which she was given TPA.  She is obviously not a TPA candidate, her symptoms on the left side  are chronic.  No symptoms of seizure-like activity.  No evidence of complete paralysis, she is actually able to move the left side extremities but with about 3 out of 5 strength left arm left leg and reports he is actually seem to be getting slowly better than when she left the hospital but seemed a little bit more pronounced today after physical therapy.  Appreciate and reviewed the inputs from neurology at today's visit.  Also reviewed discharge summary from [facility].  Patient was evaluated with MRI, also EEG while there.  No chest pain or trouble breathing.  No fevers or chills.  Mild headache, not severe or sudden at onset, not worst headache of her life.  CT of the head performed to evaluate for intracranial hemorrhage  given her TPA admission ministration as well as possible stroke diagnosed at [facility].  Clinical Course as of Jun 03 2330  Wed Jun 03, 2018  2030 Head CT is personally viewed by me at this time.  Await final radiology read.  I do not see any obvious intracranial hemorrhage   [MQ]  2223 Discussed with tele-neuro. He is advising no futher neuro workup, ok to discharge at this time.    [MQ]    Clinical Course User Index [MQ] Delman Kitten, MD    ----------------------------------------- 11:29 PM on 06/03/2018 -----------------------------------------  Patient comfortable with plan to discharge back to her home.  We will continue her follow-ups with physical therapy as well as neurology.  She is resting comfortably with normal vital signs and no distress.  She is appropriate for discharge.  Has chronic left-sided weakness over the last 1 week.  In no distress at this time.  Return precautions and treatment recommendations and follow-up discussed with the patient who is agreeable with the plan.  ____________________________________________   FINAL CLINICAL IMPRESSION(S) / ED DIAGNOSES  Final diagnoses:  Fatigue, unspecified type  Weakness of left side of body        Note:  This document was prepared using Dragon voice recognition software and may include unintentional dictation errors       Delman Kitten, MD 06/03/18 2333

## 2018-06-03 NOTE — Progress Notes (Signed)
   06/03/18 2054  Clinical Encounter Type  Visited With Health care provider  Visit Type Code  Referral From Nurse   Chaplain received a page for a code stroke for the patient. Upon arrival, she was being assessed by the medical staff. Silent, energetic prayer offered outside the room. Chaplain talked briefly with the patient's nurse for an update on her status. No additional needs at this time. No family waiting as she was brought by EMS.

## 2018-06-03 NOTE — ED Notes (Signed)
Called for follow up teleneuro consult

## 2018-06-03 NOTE — ED Notes (Signed)
STAT neuro consult called by Dr. Jacqualine Code @2020 

## 2018-06-03 NOTE — ED Notes (Signed)
Dr. Arne Cleveland from Neurology is conducting his assessment over the computer.

## 2018-06-04 NOTE — ED Provider Notes (Signed)
-----------------------------------------   12:00 AM on 06/04/2018 -----------------------------------------  Updated patient on urinalysis result.  Will discharge home per previous providers plan.  Strict return precautions given.  Patient verbalizes understanding agrees with plan of care.   Paulette Blanch, MD 06/04/18 [phone]

## 2018-06-04 NOTE — ED Notes (Signed)
Mallie Mussel RN called Glenard Haring [phone] per Pt's request. Glenard Haring will take the Pt home.

## 2018-07-27 ENCOUNTER — Emergency Department: Payer: BC Managed Care – PPO

## 2018-07-27 ENCOUNTER — Emergency Department
Admission: EM | Admit: 2018-07-27 | Discharge: 2018-07-27 | Disposition: A | Discharge status: Home / Self Care | Payer: BC Managed Care – PPO | Attending: Emergency Medicine | Admitting: Emergency Medicine

## 2018-07-27 ENCOUNTER — Other Ambulatory Visit: Payer: Self-pay

## 2018-07-27 ENCOUNTER — Encounter: Payer: Self-pay | Admitting: Emergency Medicine

## 2018-07-27 DIAGNOSIS — Z8673 Personal history of transient ischemic attack (TIA), and cerebral infarction without residual deficits: Secondary | ICD-10-CM | POA: Insufficient documentation

## 2018-07-27 DIAGNOSIS — R29810 Facial weakness: Secondary | ICD-10-CM | POA: Diagnosis not present

## 2018-07-27 DIAGNOSIS — R079 Chest pain, unspecified: Secondary | ICD-10-CM | POA: Diagnosis not present

## 2018-07-27 DIAGNOSIS — I10 Essential (primary) hypertension: Secondary | ICD-10-CM | POA: Diagnosis present

## 2018-07-27 HISTORY — DX: Essential (primary) hypertension: I10

## 2018-07-27 HISTORY — DX: Cerebral infarction, unspecified: I63.9

## 2018-07-27 LAB — BASIC METABOLIC PANEL
Anion gap: 12 (ref 5–15)
BUN: 14 mg/dL (ref 6–20)
CO2: 21 mmol/L — ABNORMAL LOW (ref 22–32)
Calcium: 9.1 mg/dL (ref 8.9–10.3)
Chloride: 99 mmol/L (ref 98–111)
Creatinine, Ser: 0.52 mg/dL (ref 0.44–1.00)
GFR calc Af Amer: >60 mL/min (ref >60)
GFR calc non Af Amer: >60 mL/min (ref >60)
Glucose, Bld: 94 mg/dL (ref 70–99)
Potassium: 3.9 mmol/L (ref 3.5–5.1)
Sodium: 132 mmol/L — ABNORMAL LOW (ref 135–145)

## 2018-07-27 LAB — CBC
HCT: 39.8 % (ref 36.0–46.0)
Hemoglobin: 13.4 g/dL (ref 12.0–15.0)
MCH: 32.7 pg (ref 26.0–34.0)
MCHC: 33.7 g/dL (ref 30.0–36.0)
MCV: 97.1 fL (ref 80.0–100.0)
Platelets: 266 10*3/uL (ref 150–400)
RBC: 4.1 MIL/uL (ref 3.87–5.11)
RDW: 12.7 % (ref 11.5–15.5)
WBC: 9.7 10*3/uL (ref 4.0–10.5)
nRBC: 0 % (ref 0.0–0.2)

## 2018-07-27 LAB — GLUCOSE, CAPILLARY: Glucose-Capillary: 94 mg/dL (ref 70–99)

## 2018-07-27 LAB — TROPONIN I
Troponin I: <0.03 ng/mL (ref <0.03)
Troponin I: <0.03 ng/mL (ref <0.03)

## 2018-07-27 LAB — BRAIN NATRIURETIC PEPTIDE: B Natriuretic Peptide: 67 pg/mL (ref 0.0–100.0)

## 2018-07-27 MED ORDER — ASPIRIN 81 MG PO CHEW: 81.0000 mg | CHEWABLE_TABLET | Freq: Once | ORAL | Status: DC

## 2018-07-27 MED ORDER — SODIUM CHLORIDE 0.9% FLUSH: 3.0000 mL | Freq: Once | INTRAVENOUS | Status: DC

## 2018-07-27 NOTE — ED Notes (Signed)
Neuro MD Dorris Fetch at bedside via telemedicine cart

## 2018-07-27 NOTE — ED Notes (Signed)
Pt updated on plan of care re: CT head and neuro consult after

## 2018-07-27 NOTE — Consult Note (Signed)
[facility] [employer] TeleNeurology Consult Services  Stat Consult  Date of Service:   07/27/2018 17:30:40  Impression:     .  Suspect conversion disorder given history of similar spells, TIA unlikely          .  Less likely complex migraine as she has no migrainous features.  Comments/Sign-Out: 53 yo F with history of conversion disorder with stroke like symptoms (stuttering speech, left sided weakness) worked up at [facility] as well as [facility] in the past. Last episode occurred in April 2020 and had negative workup then as well Today she reports a sudden chill on her right side but spared the face. This is inconsistent with ischemic stroke/TIA.   She also has stuttering speech which is functional and likely due to conversion.   She is worried that her BP meds were doubled and these are causing side effects, but I re-assured her that her BP has improved after the new medications and will be helpful in the long run. Encouraged her to keep a log of her pressures as well as any side effects to discuss with her PCP as an outpatient No neurologic indication for further workup inpatient  CT HEAD: Showed No Acute Hemorrhage or Acute Core Infarct Reviewed  Metrics: [employer] Notification Time: 07/27/2018 17:30:40 Stamp Time: 07/27/2018 17:30:40 Callback Response Time: 07/27/2018 17:33:54 Video Start Time: 07/27/2018 18:45:12 Video End Time: 07/27/2018 19:00:01  Our recommendations are outlined below.  Recommendations:     .  Antiplatelet Therapy Recommended   Disposition: Sign Off  Sign Out:     .  Discussed with Emergency Department Provider  ----------------------------------------------------------------------------------------------------  Chief Complaint: right sided chills  History of Present Illness: Patient is a 53 year old Female.  who says her symptoms started earlier today. She felt tired, and then at about 1300 she started to get real cold chills and  tremors on the right arm and leg. She denies any right facial involvement.   She had a "TIA" in April involving the left side and is taking aspirin and statin. She was seen by neurology at the time and it was thought to be conversion disorder which she has had episodes like that in the past.  On exam this evening she has no facial droop or weakness. She feels number on the left face compared to the right. Otherwise nonfocal exam except for stuttering wavering functional speech.  Examination: BP(145/89), Blood Glucose(94) 1A: Level of Consciousness - Alert; keenly responsive + 0 1B: Ask Month and Age - Both Questions Right + 0 1C: Blink Eyes & Squeeze Hands - Performs Both Tasks + 0 2: Test Horizontal Extraocular Movements - Normal + 0 3: Test Visual Fields - Partial Hemianopia + 1 4: Test Facial Palsy (Use Grimace if Obtunded) - Normal symmetry + 0 5A: Test Left Arm Motor Drift - No Drift for 10 Seconds + 0 5B: Test Right Arm Motor Drift - No Drift for 10 Seconds + 0 6A: Test Left Leg Motor Drift - No Drift for 5 Seconds + 0 6B: Test Right Leg Motor Drift - No Drift for 5 Seconds + 0 7: Test Limb Ataxia (FNF/Heel-Shin) - No Ataxia + 0 8: Test Sensation - Mild-Moderate Loss: Less Sharp/More Dull + 1 9: Test Language/Aphasia - Normal; No aphasia + 0 10: Test Dysarthria - Mild-Moderate Dysarthria: Slurring but can be understood + 1 11: Test Extinction/Inattention - No abnormality + 0  NIHSS Score: 3  Patient/Family was informed the Neurology Consult would happen via TeleHealth  consult by way of interactive audio and video telecommunications and consented to receiving care in this manner.  Due to the immediate potential for life-threatening deterioration due to underlying acute neurologic illness, I spent 35 minutes providing critical care. This time includes time for face to face visit via telemedicine, review of medical records, imaging studies and discussion of findings with providers, the  patient and/or family.   Dr Leda Quail   [facility] [phone]   Case [id number]

## 2018-07-27 NOTE — ED Triage Notes (Signed)
Per [facility] patient brought for hypertension. They said home healthnurse went to patient house 2 hr ago and they said she was hypertensive.  Her losartin has been doubled.  They told ems that her speech and facial droop are unchanged from baseline.  Patient is able to tell me that she had heart racing at home, but she feels better now.

## 2018-07-27 NOTE — Discharge Instructions (Addendum)
Please call the cardiologist, Dr. Saralyn Pilar, in the morning.  Let his office staff know that you were seen in the emergency room tonight with chest pain.  They should b be able to followi you up very rapidly.  The neurologist recommend you take some blood thinners.  We will start you on 1 baby aspirin a day.  Your dose here before you go.  Please return for any further problems.  Also follow-up with your regular doctor to keep an eye on your blood pressure.  Continue the dose that you are on now.

## 2018-07-27 NOTE — ED Provider Notes (Signed)
[facility] Emergency Department Provider Note   ____________________________________________   First MD Initiated Contact with Patient 07/27/18 1640     (approximate)  I have reviewed the triage vital signs and the nursing notes.   HISTORY  Chief Complaint Hypertension   HPI Heather Simpson is a 53 y.o. female patient sent from home by home health nurse for high blood pressure.  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  She got very shaky her heart began to get fluttery and recently she had some chest pressure and on her right side which is her good side got cold shaky chills.  She is never had any of this before.  Her symptoms from her prior stroke of almost resolved except for little bit of left leg weakness and the speech impediment.  Consists of stuttering-like speech with some difficulty finding words and pronouncing them.  Her speech is intelligible though.  Patient reports the symptoms lasted about an hour her blood pressure went up to 765 systolic.  Her pressure is now normal.  She has had her antihypertensive doubled recently.         Past Medical History:  Diagnosis Date  . Hypertension   . Stroke [facility]    May 27, 2018    There are no active problems to display for this patient.   Past Surgical History:  Procedure Laterality Date  . BREAST BIOPSY Left 2015   benign  . uterine ablasion      Prior to Admission medications   Not on File    Allergies Patient has no known allergies.  Family History  Problem Relation Age of Onset  . Breast cancer Neg Hx     Social History Social History   Tobacco Use  . Smoking status: Never Smoker  . Smokeless tobacco: Never Used  Substance Use Topics  . Alcohol use: Never    Frequency: Never  . Drug use: Never     Review of Systems  Constitutional: No fever/chills Eyes: No visual changes. ENT: No sore throat. Cardiovascular: Denies chest pain at present. Respiratory: Denies shortness of breath. Gastrointestinal: No abdominal pain.  No nausea, no vomiting.  No diarrhea.  No constipation. Genitourinary: Negative for dysuria. Musculoskeletal: Negative for back pain. Skin: Negative for rash. Neurological: Negative for headaches, focal weakness   ____________________________________________   PHYSICAL EXAM:  VITAL SIGNS: ED Triage Vitals  Enc Vitals Group     BP 07/27/18 1602 137/87     Pulse Rate 07/27/18 1602 92     Resp 07/27/18 1602 16     Temp 07/27/18 1602 98.3 F (36.8 C)     Temp Source 07/27/18 1602 Oral     SpO2 07/27/18 1602 98 %     Weight 07/27/18 1603 136 lb (61.7 kg)     Height 07/27/18 1603 5\' 2"  (1.575 m)     Head Circumference --      Peak Flow --      Pain Score 07/27/18 1602 0     Pain Loc --      Pain Edu? --      Excl. in [place]? --     Constitutional: Alert and oriented. Well appearing and in no acute distress. Eyes: Conjunctivae are normal.  Head: Atraumatic. Nose: No congestion/rhinnorhea. Mouth/Throat: Mucous  membranes are moist.  Oropharynx non-erythematous. Neck: No stridor.  Cardiovascular: Normal rate, regular rhythm. Grossly normal heart sounds.  Good peripheral circulation. Respiratory: Normal respiratory effort.  No retractions. Lungs CTAB. Gastrointestinal: Soft and nontender. No distention. No abdominal bruits. No CVA tenderness. Musculoskeletal: No lower extremity tenderness nor edema.   Neurologic: See HPI no new deficits are present currently Skin:  Skin is warm, dry and intact. No rash noted.   ____________________________________________   LABS (all labs ordered are listed, but only abnormal results are displayed)  Labs Reviewed  BASIC METABOLIC PANEL - Abnormal; Notable for the following components:      Result Value   Sodium 132  (*)    CO2 21 (*)    All other components within normal limits  GLUCOSE, CAPILLARY  CBC  TROPONIN I   ____________________________________________  EKG  EKG read interpreted by me shows normal sinus rhythm rate of 87 left axis no acute changes ____________________________________________  RADIOLOGY  ED MD interpretation: Chest x-ray read by radiology reviewed by me shows no active disease.  Official radiology report(s): Dg Chest Port 1 View  Result Date: 07/27/2018 CLINICAL DATA:  Hypertension.  Chest pain. EXAM: PORTABLE CHEST 1 VIEW COMPARISON:  None. FINDINGS: The heart size and mediastinal contours are within normal limits. Both lungs are clear. The visualized skeletal structures are unremarkable. IMPRESSION: No active disease. Electronically Signed   By: Dorise Bullion III M.D   On: 07/27/2018 17:20    ____________________________________________   PROCEDURES  Procedure(s) performed (including Critical Care):  Procedures   ____________________________________________   INITIAL IMPRESSION / ASSESSMENT AND PLAN / ED COURSE  Patient seen by tele-neurology.  He does not feel there was a TIA present.  He does recommend antiplatelet therapy so we will start her on 1 baby aspirin a day.  Have her follow-up with her regular doctor.  Also have her follow-up with cardiology because of her chest discomfort that she had today.  Her troponins were negative.  EKG and chest x-ray look okay.              ____________________________________________   FINAL CLINICAL IMPRESSION(S) / ED DIAGNOSES  Final diagnoses:  Chest pain     ED Discharge Orders    None       Note:  This document was prepared using Dragon voice recognition software and may include unintentional dictation errors.    Nena Polio, MD 07/27/18 1929

## 2018-07-27 NOTE — ED Notes (Signed)

## 2018-07-27 NOTE — ED Triage Notes (Signed)
Patient became calmer during triage and able to get her words out much better.  THis especially improved when we were left alone.  The patient speaks slowly and haltingly, but is able to communicate effectively.

## 2018-07-27 NOTE — ED Notes (Signed)
Neuro cart at bedside. 

## 2018-08-25 ENCOUNTER — Telehealth: Payer: Self-pay | Admitting: Obstetrics & Gynecology

## 2018-08-25 NOTE — Telephone Encounter (Signed)
[facility] referring for Postmenopausal Vaginal bleeding. Called and left voicemail for patient to call back to be schedule

## 2018-08-26 ENCOUNTER — Encounter: Payer: Self-pay | Admitting: Obstetrics and Gynecology

## 2018-09-02 ENCOUNTER — Other Ambulatory Visit (HOSPITAL_COMMUNITY)
Admission: RE | Admit: 2018-09-02 | Discharge: 2018-09-02 | Disposition: A | Discharge status: Home / Self Care | Payer: BC Managed Care – PPO | Source: Ambulatory Visit | Attending: Obstetrics and Gynecology | Admitting: Obstetrics and Gynecology

## 2018-09-02 ENCOUNTER — Other Ambulatory Visit: Payer: Self-pay

## 2018-09-02 ENCOUNTER — Encounter: Payer: Self-pay | Admitting: Obstetrics and Gynecology

## 2018-09-02 ENCOUNTER — Ambulatory Visit (INDEPENDENT_AMBULATORY_CARE_PROVIDER_SITE_OTHER): Payer: BC Managed Care – PPO | Admitting: Obstetrics and Gynecology

## 2018-09-02 VITALS — BP 138/85 | HR 57 | Ht 62.0 in | Wt 154.0 lb

## 2018-09-02 DIAGNOSIS — N950 Postmenopausal bleeding: Secondary | ICD-10-CM | POA: Diagnosis not present

## 2018-09-02 DIAGNOSIS — R87810 Cervical high risk human papillomavirus (HPV) DNA test positive: Secondary | ICD-10-CM | POA: Diagnosis not present

## 2018-09-02 DIAGNOSIS — Z1151 Encounter for screening for human papillomavirus (HPV): Secondary | ICD-10-CM | POA: Insufficient documentation

## 2018-09-02 NOTE — Progress Notes (Signed)
Obstetrics & Gynecology Office Visit   Chief Complaint  Patient presents with  . Post menopausal bleeding    Referred by Cathlean Sauer FNP  . vasomotor S&S  Referral from [place], [place], for postmenopausal bleeding  History of Present Illness: 53 y.o. G1P1 female who presents in referral from [facility], [place], [place], for postmenopausal bleeding.  She started having vaginal bleeding at the end of March this years. She had no menses for 1.5 years prior to that.  She had some pain on her left side and passed some small clots and had some bleeding.  She had bleeding off and on for about 5-6 days.  She has had spotting here and there, but no clots, since that time.  She reports a stroke on 05/27/2018.  I reviewed her chart through the [facility] and through [facility] (she had an extensive workup at [facility]) and no definitive stroke was diagnosed.  She has been undergoing physical therapy since that time with improvement, per her report.  She had an endometrial ablation 2-3 years ago in [place].  She had about 6 months without any menses.  Then her bleeding came back and lasted 3-4 days and would occur monthly.  She has gained weight due to her stroke.  She denies new constipation.  She notes new bloating.  She thinks she had a pap smear recently (last few years). She does report hot flashes and inability to sleep due to vasomotor symptoms.   Health Maintenance: Colonoscopy: she has had some "years" ago Last pap smear: reports one a couple of years ago Mammogram: 1 year, BiRads 1  Past Medical History:  Diagnosis Date  . Hypertension   . Stroke [facility])    May 27, 2018    Past Surgical History:  Procedure Laterality Date  . BREAST BIOPSY Left 2015   benign  . uterine ablasion     Gynecologic History: No LMP recorded. Patient is perimenopausal.  Obstetric History: G1P1  Family History  Problem Relation Age of Onset  .  Breast cancer Neg Hx     Social History   Socioeconomic History  . Marital status: Single    Spouse name: Not on file  . Number of children: Not on file  . Years of education: Not on file  . Highest education level: Not on file  Occupational History  . Not on file  Social Needs  . Financial resource strain: Not on file  . Food insecurity    Worry: Not on file    Inability: Not on file  . Transportation needs    Medical: Not on file    Non-medical: Not on file  Tobacco Use  . Smoking status: Light Tobacco Smoker  . Smokeless tobacco: Never Used  Substance and Sexual Activity  . Alcohol use: Yes    Frequency: Never    Comment: occ  . Drug use: Never  . Sexual activity: Not Currently    Birth control/protection: None, Post-menopausal  Lifestyle  . Physical activity    Days per week: Not on file    Minutes per session: Not on file  . Stress: Not on file  Relationships  . Social Herbalist on phone: Not on file    Gets together: Not on file    Attends religious service: Not on file    Active member of club or organization: Not on file    Attends meetings of clubs or organizations: Not  on file    Relationship status: Not on file  . Intimate partner violence    Fear of current or ex partner: Not on file    Emotionally abused: Not on file    Physically abused: Not on file    Forced sexual activity: Not on file  Other Topics Concern  . Not on file  Social History Narrative  . Not on file    Allergies  Allergen Reactions  . Hydrocodone-Acetaminophen Other (See Comments)    "spit up blood"      Prior to Admission medications   Medication Sig Start Date End Date Taking? Authorizing Provider  acetaminophen (TYLENOL) 650 MG CR tablet Take 650 mg by mouth every 8 (eight) hours as needed for pain.   Yes [provider]  aspirin EC 81 MG tablet Take 81 mg by mouth daily.   Yes [provider]  atorvastatin (LIPITOR) 80 MG tablet Take 80 mg  by mouth daily. 08/27/18  Yes [provider]  Cyanocobalamin (VITAMIN B-12 PO) Take 1 Dose by mouth daily. Liquid   Yes [provider]  cyclobenzaprine (FLEXERIL) 10 MG tablet Take 10 mg by mouth at bedtime as needed for spasms. 04/14/18  Yes [provider]  losartan (COZAAR) 50 MG tablet Take 50 mg by mouth at bedtime. for high blood pressure 07/29/18  Yes [provider]   Review of Systems  Constitutional: Negative.   HENT: Negative.   Eyes: Negative.   Respiratory: Negative.   Cardiovascular: Negative.   Gastrointestinal: Negative.   Genitourinary: Negative.   Musculoskeletal: Negative.   Skin: Negative.   Neurological: Negative.   Psychiatric/Behavioral: Negative.      Physical Exam BP 138/85 (BP Location: Left Arm, Patient Position: Sitting, Cuff Size: Normal)   Pulse (!) 57   Ht 5\' 2"  (1.575 m)   Wt 154 lb (69.9 kg)   BMI 28.17 kg/m  No LMP recorded. Patient is perimenopausal. Physical Exam Constitutional:      General: She is not in acute distress.    Appearance: Normal appearance. She is well-developed.  Genitourinary:     Pelvic exam was performed with patient in the lithotomy position.     Vulva, inguinal canal, urethra, bladder, vagina, uterus, right adnexa and left adnexa normal.     No posterior fourchette tenderness, injury or lesion present.     No cervical friability, lesion, bleeding or polyp.  HENT:     Head: Normocephalic and atraumatic.  Eyes:     General: No scleral icterus.    Conjunctiva/sclera: Conjunctivae normal.  Neck:     Musculoskeletal: Normal range of motion and neck supple.  Cardiovascular:     Rate and Rhythm: Regular rhythm. Bradycardia present.     Heart sounds: No murmur. No friction rub. No gallop.      Comments: Rate in 50s Pulmonary:     Effort: Pulmonary effort is normal. No respiratory distress.     Breath sounds: Normal breath sounds. No wheezing or rales.  Abdominal:     General: Bowel  sounds are normal. There is no distension.     Palpations: Abdomen is soft. There is no mass.     Tenderness: There is no abdominal tenderness. There is no guarding or rebound.  Musculoskeletal: Normal range of motion.  Neurological:     Mental Status: She is alert and oriented to person, place, and time.     Cranial Nerves: Cranial nerve deficit present.     Comments: Speech is  garbled  Skin:    General: Skin is warm and dry.     Findings: No erythema.  Psychiatric:        Mood and Affect: Mood normal.        Behavior: Behavior normal.        Judgment: Judgment normal.     Female chaperone present for pelvic and breast  portions of the physical exam  Assessment: 53 y.o. G1P1 female here for  1. Postmenopausal bleeding      Plan: Problem List Items Addressed This Visit      Other   Postmenopausal bleeding - Primary   Relevant Orders   Cytology - PAP   US PELVIS TRANSVAGINAL NON-OB (TV ONLY)     Discussed significance of menopausal bleeding.  Work-up will include a Pap smear (performed today), pelvic ultrasound, and possible endometrial sampling.  Making this more difficult as the fact that she states she had an endometrial ablation in [place] 2 or 3 years ago.  However, we have no records of this.  Will discuss follow-up and further work-up based on an ultrasound which we will obtain next.  Discussed importance of keeping follow-up as there is a risk of endometrial cancer with menopausal bleeding.  30 minutes spent in face to face discussion with > 50% spent in counseling,management, and coordination of care of her postmenopausal bleeding.   Prentice Docker, MD 09/02/2018 11:34 AM     CC: Gennette Pac, FNP [place], Elyse Jarvis, MD [address] [place],  [place] [address]

## 2018-09-04 ENCOUNTER — Other Ambulatory Visit
Admission: RE | Admit: 2018-09-04 | Discharge: 2018-09-04 | Disposition: A | Discharge status: Home / Self Care | Payer: BC Managed Care – PPO | Source: Ambulatory Visit | Attending: Physician Assistant | Admitting: Physician Assistant

## 2018-09-04 ENCOUNTER — Other Ambulatory Visit: Payer: Self-pay

## 2018-09-04 DIAGNOSIS — Z01812 Encounter for preprocedural laboratory examination: Secondary | ICD-10-CM | POA: Insufficient documentation

## 2018-09-04 DIAGNOSIS — Z1159 Encounter for screening for other viral diseases: Secondary | ICD-10-CM | POA: Diagnosis not present

## 2018-09-04 DIAGNOSIS — R079 Chest pain, unspecified: Secondary | ICD-10-CM | POA: Diagnosis not present

## 2018-09-05 LAB — SARS CORONAVIRUS 2 (TAT 6-24 HRS): SARS Coronavirus 2: NEGATIVE

## 2018-09-07 ENCOUNTER — Other Ambulatory Visit: Payer: Self-pay

## 2018-09-07 ENCOUNTER — Encounter: Payer: Self-pay | Admitting: Obstetrics and Gynecology

## 2018-09-07 ENCOUNTER — Ambulatory Visit (INDEPENDENT_AMBULATORY_CARE_PROVIDER_SITE_OTHER): Payer: BC Managed Care – PPO

## 2018-09-07 ENCOUNTER — Ambulatory Visit (INDEPENDENT_AMBULATORY_CARE_PROVIDER_SITE_OTHER): Payer: BC Managed Care – PPO | Admitting: Obstetrics and Gynecology

## 2018-09-07 DIAGNOSIS — D252 Subserosal leiomyoma of uterus: Secondary | ICD-10-CM | POA: Diagnosis not present

## 2018-09-07 DIAGNOSIS — N950 Postmenopausal bleeding: Secondary | ICD-10-CM

## 2018-09-07 DIAGNOSIS — D251 Intramural leiomyoma of uterus: Secondary | ICD-10-CM | POA: Diagnosis not present

## 2018-09-07 DIAGNOSIS — D250 Submucous leiomyoma of uterus: Secondary | ICD-10-CM | POA: Diagnosis not present

## 2018-09-07 NOTE — Progress Notes (Signed)
°  °  Virtual Visit via Telephone Note  I connected with [name] on 09/07/18 at  4:30 PM EDT by telephone and verified that I am speaking with the correct person using two identifiers.   I discussed the limitations, risks, security and privacy concerns of performing an evaluation and management service by telephone and the availability of in person appointments. I also discussed with the patient that there may be a patient responsible charge related to this service. The patient expressed understanding and agreed to proceed.  The patient was at home I spoke with the patient from my office The names of people involved in this encounter were: [name] and Prentice Docker, MD.   Chief Complaint: 53 y.o. G1P1 female who had an ultrasound for postmenopausal bleeding  History of Present Illness: Ultrasound demonstrates the following findings Adnexa: no masses seen  Uterus: retroverted with endometrial stripe  3.0 mm Additional: multiple fibroids, obscuring the total endometrial stripe.   She has had no bleeding since early April.   Observations/Objective: Physical Exam could not be performed. Because of the COVID-19 outbreak this visit was performed over the phone and not in person.   Imaging Results US Pelvis Transvaginal Non-ob (tv Only)  Result Date: 09/07/2018 Patient Name: Heather Simpson DOB: 1965/05/30 MRN: [id number] ULTRASOUND REPORT Location: [facility] Date of Service: 09/07/2018 Indications: Postmenopausal bleeding The uterus is retroverted and measures 7.4 x 6.5 x 6.0 cm. Echo texture is heterogenous with evidence of focal masses. Within the uterus are multiple suspected fibroids measuring: Fibroid 1: 17.0 x 14.7 x 16.6 mm approximately 50% submucosal Fibroid 2: 16.6 x 25.7 x 21.1 mm fundal less than 50% submucosal Fibroid 3: 18.3 x 19.2 x 18.7 mm. Intramural Fibroid 4: 22.7 x 24.0 x 15.4 mm posterior subserosal Fibroid 5: 11 x 9 x 10 mm less than 50% submucosal  Fibroid 6: 29 x 29 x 28 mm subserosal right The Endometrium measures 3.0 mm. Right Ovary: measures 2.0 x 2.0 x 1.4 cm. It is normal in appearance. Left Ovary: measures 2.6 x 0.8 x 0.8 cm. It is normal in appearance. Survey of the adnexa demonstrates no adnexal masses. There is no free fluid in the cul de sac. Impression: 1. The endometrium appears thin but is only partially visible due to multiple fibroids. 2. The largest fibroids and the ones closest to the endometrium were measured. 3. Normal ovaries. Gweneth Dimitri, RT The ultrasound images and findings were reviewed by me and I agree with the above report. Prentice Docker, MD, [facility], [facility] 09/07/2018 4:52 PM       Assessment and Plan:   ICD-10-CM   1. Postmenopausal bleeding  N95.0      Follow Up Instructions: In-office endometrial biopsy.  Patient to call and schedule.   I discussed the assessment and treatment plan with the patient. The patient was provided an opportunity to ask questions and all were answered. The patient agreed with the plan and demonstrated an understanding of the instructions.   The patient was advised to call back or seek an in-person evaluation if the symptoms worsen or if the condition fails to improve as anticipated.  I provided 17 minutes of non-face-to-face time during this encounter.  Prentice Docker, MD  [facility], [facility] 09/07/2018 4:47 PM

## 2018-09-08 ENCOUNTER — Other Ambulatory Visit: Payer: Self-pay

## 2018-09-08 ENCOUNTER — Encounter
Admission: RE | Disposition: A | Discharge status: Home / Self Care | Payer: Self-pay | Source: Home / Self Care | Attending: Cardiology

## 2018-09-08 ENCOUNTER — Ambulatory Visit
Admission: RE | Admit: 2018-09-08 | Discharge: 2018-09-08 | Disposition: A | Discharge status: Home / Self Care | Payer: BC Managed Care – PPO | Attending: Cardiology | Admitting: Cardiology

## 2018-09-08 DIAGNOSIS — I639 Cerebral infarction, unspecified: Secondary | ICD-10-CM | POA: Diagnosis present

## 2018-09-08 DIAGNOSIS — Z7982 Long term (current) use of aspirin: Secondary | ICD-10-CM | POA: Insufficient documentation

## 2018-09-08 DIAGNOSIS — Z79899 Other long term (current) drug therapy: Secondary | ICD-10-CM | POA: Diagnosis not present

## 2018-09-08 DIAGNOSIS — I10 Essential (primary) hypertension: Secondary | ICD-10-CM | POA: Diagnosis not present

## 2018-09-08 DIAGNOSIS — Z8249 Family history of ischemic heart disease and other diseases of the circulatory system: Secondary | ICD-10-CM | POA: Diagnosis not present

## 2018-09-08 DIAGNOSIS — R0602 Shortness of breath: Secondary | ICD-10-CM | POA: Diagnosis not present

## 2018-09-08 DIAGNOSIS — Z87891 Personal history of nicotine dependence: Secondary | ICD-10-CM | POA: Insufficient documentation

## 2018-09-08 DIAGNOSIS — E785 Hyperlipidemia, unspecified: Secondary | ICD-10-CM | POA: Diagnosis not present

## 2018-09-08 DIAGNOSIS — Z885 Allergy status to narcotic agent status: Secondary | ICD-10-CM | POA: Insufficient documentation

## 2018-09-08 HISTORY — PX: LOOP RECORDER INSERTION: EP1214

## 2018-09-08 SURGERY — LOOP RECORDER INSERTION
Anesthesia: Moderate Sedation

## 2018-09-08 MED ORDER — LIDOCAINE-EPINEPHRINE (PF) 1 %-1:200000 IJ SOLN
INTRAMUSCULAR | Status: DC | PRN
  Administered 2018-09-08: 20 mL

## 2018-09-08 MED ORDER — LIDOCAINE-EPINEPHRINE (PF) 1 %-1:200000 IJ SOLN
INTRAMUSCULAR | Status: AC
  Filled 2018-09-08: qty 10

## 2018-09-08 MED ORDER — ACETAMINOPHEN 325 MG PO TABS
650.0000 mg | ORAL_TABLET | Freq: Four times a day (QID) | ORAL | Status: DC | PRN
  Administered 2018-09-08: 08:00:00 650 mg via ORAL

## 2018-09-08 MED ORDER — ACETAMINOPHEN 325 MG PO TABS
ORAL_TABLET | ORAL | Status: AC
  Filled 2018-09-08: qty 2

## 2018-09-08 SURGICAL SUPPLY — 3 items
LOOP REVEAL LINQSYS (Prosthesis & Implant Heart) ×2 IMPLANT
PACK LOOP INSERTION (CUSTOM PROCEDURE TRAY) ×2 IMPLANT
SLEEVE ISOL F/PACE RF HD COVER (MISCELLANEOUS) ×2 IMPLANT

## 2018-09-09 ENCOUNTER — Encounter: Payer: Self-pay | Admitting: Cardiology

## 2018-09-09 ENCOUNTER — Encounter: Payer: Self-pay | Admitting: Obstetrics and Gynecology

## 2018-09-09 LAB — CYTOLOGY - PAP
Adequacy: ABSENT
Diagnosis: NEGATIVE
HPV 16/18/45 genotyping: NEGATIVE
HPV: DETECTED — AB

## 2018-09-18 ENCOUNTER — Encounter: Payer: Self-pay | Admitting: Obstetrics and Gynecology

## 2018-09-18 ENCOUNTER — Ambulatory Visit (INDEPENDENT_AMBULATORY_CARE_PROVIDER_SITE_OTHER): Payer: BC Managed Care – PPO | Admitting: Obstetrics and Gynecology

## 2018-09-18 ENCOUNTER — Other Ambulatory Visit: Payer: Self-pay

## 2018-09-18 ENCOUNTER — Other Ambulatory Visit (HOSPITAL_COMMUNITY)
Admission: RE | Admit: 2018-09-18 | Discharge: 2018-09-18 | Disposition: A | Discharge status: Home / Self Care | Payer: BC Managed Care – PPO | Source: Ambulatory Visit | Attending: Obstetrics and Gynecology | Admitting: Obstetrics and Gynecology

## 2018-09-18 VITALS — BP 128/88 | Ht 62.0 in | Wt 152.0 lb

## 2018-09-18 DIAGNOSIS — N950 Postmenopausal bleeding: Secondary | ICD-10-CM | POA: Diagnosis present

## 2018-09-18 NOTE — Progress Notes (Signed)
    Endometrial Biopsy After discussion with the patient regarding her abnormal uterine bleeding I recommended that she proceed with an endometrial biopsy for further diagnosis. The risks, benefits, alternatives, and indications for an endometrial biopsy were discussed with the patient in detail. She understood the risks including infection, bleeding, cervical laceration and uterine perforation.  Verbal consent was obtained.   PROCEDURE NOTE:  P[place] endometrial biopsy was performed using aseptic technique with iodine preparation.  The uterus was sounded to a length of 6.5 cm.  Adequate sampling was obtained with minimal blood loss.  The patient tolerated the procedure well.  Disposition will be pending pathology.  Prentice Docker, MD  [facility], [facility] 09/18/2018  3:58 PM

## 2018-09-25 ENCOUNTER — Telehealth: Payer: Self-pay

## 2018-09-25 NOTE — Telephone Encounter (Signed)
Pt notified results have not been reviewed as SDJ out of office this week. He is due to return to office on Tuesday.

## 2018-09-25 NOTE — Telephone Encounter (Signed)
Pt inquiring about biopsy results [phone]

## 2018-10-05 NOTE — Telephone Encounter (Signed)
Initially called patient and could hear her talking in the background but then was disconnected.  Called the patient again and the call went straight to voicemail.  I left a generic that voicemail message for her to call me back.

## 2018-10-06 NOTE — Telephone Encounter (Signed)
Patient is calling for labs results. Please advise. 

## 2018-10-08 ENCOUNTER — Telehealth: Payer: Self-pay

## 2018-10-08 NOTE — Telephone Encounter (Signed)
Pt calling for results.  [phone]

## 2018-10-08 NOTE — Telephone Encounter (Signed)
Patient is calling for results. Please advise

## 2018-10-09 NOTE — Telephone Encounter (Signed)
Message has been sent to [facility] for call back

## 2018-10-09 NOTE — Telephone Encounter (Signed)
Pt aware via vm that SDJ will call when he gets a chance. hes seeing patients this AM and has surgery on lunch .

## 2018-10-12 NOTE — Telephone Encounter (Signed)
Left generic VM for patient to call me back.

## 2018-10-13 NOTE — Telephone Encounter (Signed)
Patient is calling for results. Patient aware SDJ out of office returns tomorrow in [place]

## 2018-10-14 NOTE — Telephone Encounter (Signed)
Discussed normal biopsy results with patient.  Discussed that since she has had no bleeding since before April and continues to have none, we can continue to monitor her symptoms without further treatment or investigation.  However, if she has more bleeding, we would need to perform additional work-up.  It would be important for her to follow-up with any additional bleeding.  She voiced understanding and agreement.  Of note, I attempted to contact this patient on August 10 and 17, as well.

## 2018-10-21 ENCOUNTER — Ambulatory Visit: Payer: BC Managed Care – PPO | Attending: Family Medicine | Admitting: Occupational Therapy

## 2018-10-21 ENCOUNTER — Encounter: Payer: Self-pay | Admitting: Occupational Therapy

## 2018-10-21 ENCOUNTER — Other Ambulatory Visit: Payer: Self-pay

## 2018-10-21 DIAGNOSIS — M6281 Muscle weakness (generalized): Secondary | ICD-10-CM

## 2018-10-21 DIAGNOSIS — R278 Other lack of coordination: Secondary | ICD-10-CM | POA: Insufficient documentation

## 2018-10-21 NOTE — Therapy (Signed)
[place] MAIN [facility] SERVICES [address] [place], [place], [address] Phone: [phone]   Fax:  [phone]  Occupational Therapy Evaluation  Patient Details  Name: Heather Simpson MRN: [id number] Date of Birth: [birth year] Referring Provider (OT): Gennette Pac   Encounter Date: 10/21/2018  OT End of Session - 10/21/18 1546    Visit Number  1    Number of Visits  24    Date for OT Re-Evaluation  01/13/19    Authorization Type  Progress reporting period starting 10/21/2018    OT Start Time  0900    OT Stop Time  1000    OT Time Calculation (min)  60 min    Activity Tolerance  Patient tolerated treatment well    Behavior During Therapy  [facility] for tasks assessed/performed       Past Medical History:  Diagnosis Date  . Hypertension   . Stroke [facility])    May 27, 2018    Past Surgical History:  Procedure Laterality Date  . BREAST BIOPSY Left 2015   benign  . LOOP RECORDER INSERTION N/A 09/08/2018   Procedure: LOOP RECORDER INSERTION;  Surgeon: Isaias Cowman, MD;  Location: [place] [facility];  Service: Cardiovascular;  Laterality: N/A;  . uterine ablasion      There were no vitals filed for this visit.  Subjective Assessment - 10/21/18 1224    Subjective   Pt. reports that she thought she was going to have Physical Therapy as pt. reports that she really needs it for balance. A referral was made for PT services.    Patient is accompanied by:  Family member    Pertinent History  Pt. is a 53 y.o. 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 Pt. was give TPA at onset. Pt. had another episode with an ER visit on 07/27/2018. Pt. with a PMHx of Anxiety, Depression.    Limitations  LUE weakness.    Patient Stated Goals  To improve walking, and LUE strength    Currently in Pain?  Yes    Pain Score  7     Pain Location  Neck    Pain Orientation  Left    Pain  Descriptors / Indicators  Aching    Pain Type  Chronic pain    Pain Onset  More than a month ago        [facility] OT Assessment - 10/21/18 0917      Assessment   Medical Diagnosis  Conversion Disorder with Stroke Like symptoms.    Referring Provider (OT)  Raquel Sarna Headrick    Onset Date/Surgical Date  05/27/18    Hand Dominance  Right    Next MD Visit  11/05/2018      Precautions   Precautions  None      Restrictions   Weight Bearing Restrictions  No      Balance Screen   Has the patient fallen in the past 6 months  Yes    How many times?  1   dizziness   Has the patient had a decrease in activity level because of a fear of falling?   No    Is the patient reluctant to leave their home because of a fear of falling?   No      Home  Environment   Family/patient expects to be discharged to:  Private residence    Living Arrangements  Spouse/significant other  Type of Home  Other (Comment)   Duplex   Home Access  Stairs    Home Layout  One level    Alternate Level Stairs - Number of Steps  4    Bathroom Shower/Tub  Tub/Shower unit;Tub only    How accessible  --   WIth a cane   Adaptive equipment  Long-handled shoe horn    [place] -quad;Bedside commode;Shower seat    Lives With  [employer])      Prior Function   Level of Independence  Independent    Vocation  On disability    Vocation Requirements  --   Warehouse OGP for [employer]      ADL   Eating/Feeding  Independent   Occasionally drops items   Grooming  Independent    Lower Body Bathing  Independent    Upper Body Dressing  Independent;Increased time    Lower Body Dressing  Independent    Toilet Transfer  Modified independent    Toileting - Astronomer -  Hygiene  Independent    Tub/Shower Transfer  Modified independent      IADL   Prior Level of Function Dealer for transportation;Needs to be  accompanied on any shopping trip    Prior Level of Function Light Housekeeping  Independent    Light Housekeeping  All laundry must be done by others;Performs light daily tasks such as dishwashing, bed making   Does not have a washer, and dryer   Prior Level of Function Meal Prep  Independent    Meal Prep  Able to complete simple warm meal prep    Prior Level of Function Community Mobility  Independent    Community Mobility  Relies on family or friends for transportation    Prior Level of Function Medication Managment  Independent    Medication Management  Is responsible for taking medication in correct dosages at correct time    Prior Level of Function Herbalist financial matters independently (budgets, writes checks, pays rent, bills goes to bank), collects and keeps track of income      Mobility   Mobility Status  History of falls      Written Expression   Dominant Hand  Right    Handwriting  100% legible      Vision - History   Baseline Vision  Wears glasses all the time    Patient Visual Report  --   Blurred vision with L eye drooping at onset. Has resolved.     Activity Tolerance   Activity Tolerance  Tolerates 10-20 min activity with multiple rests      Cognition   Overall Cognitive Status  Impaired/Different from baseline    Memory  Impaired      Sensation   Light Touch  Appears Intact    Proprioception  Appears Intact      Coordination   Right 9 Hole Peg Test  26    Left 9 Hole Peg Test  37      Strength   Overall Strength Comments  RUE 5/5, LUE: 4/5      Hand Function   Right Hand Grip (lbs)  43    Right Hand Lateral Pinch  14 lbs    Right Hand 3 Point Pinch  10 lbs    Left Hand  Grip (lbs)  12    Left Hand Lateral Pinch  6 lbs    Left 3 point pinch  7 lbs                        OT Short Term Goals - 10/21/18 1558      OT SHORT TERM GOAL #1   Title  --    Baseline   --    Time  --    Status  --        OT Long Term Goals - 10/21/18 1652      OT LONG TERM GOAL #1   Title  Pt. will increase LUE strength by 25mm grades to assist with ADLs, and IADLs.    Baseline  Eval: LUE functioning.    Time  12    Period  Weeks    Status  New    Target Date  01/13/19      OT LONG TERM GOAL #2   Title  Pt. will increase left grip strength by 10# to be able to hold a pitcher, and pour a beverage.    Baseline  Eval: decreased left grip strength    Time  12    Period  Weeks    Status  New    Target Date  01/13/19      OT LONG TERM GOAL #3   Title  Pt. will increase left pinch strength by 3 pounds in order to be able to be able to open bottles    Baseline  Eval: Pt. has difficulty opening bottles    Time  12    Period  Weeks    Status  New    Target Date  01/13/19      OT LONG TERM GOAL #4   Title  Pt. will indpendenltly fold laundry    Baseline  Eval: Pt. has difficulty folding laundry.    Time  12    Period  Weeks    Status  New    Target Date  01/13/19            Plan - 10/21/18 1549    Clinical Impression Statement  Pt. is a 53 y.o. female who was diagnosed with Conversion Disorder with stroke like sympoms. Pt. presents with decreasesd LUE strength, grip strength, pinch strength, and impaired [facility] skills which limits her ability to perform ADL, and IADL functioning. Pt. scored 51/76 sum score. Pt. will benefit from skilled OT services to improve LUE functioning in order to be able to perform ADLs, and IADL functioning including: folding laundry, opening jars, taking items/cards out of her wallet, holding a pitcher, and poring a drink.    Occupational performance deficits (Please refer to evaluation for details):  ADL's;IADL's    Body Structure / Function / Physical Skills  ADL;IADL;Pain;Coordination;FMC;UE functional use;Strength    Rehab Potential  Good    Clinical Decision Making  Several treatment options, min-mod task modification necessary     Comorbidities Affecting Occupational Performance:  May have comorbidities impacting occupational performance    Modification or Assistance to Complete Evaluation   Min-Moderate modification of tasks or assist with assess necessary to complete eval    OT Frequency  2x / week    OT Duration  12 weeks    OT Treatment/Interventions  Self-care/ADL training;Neuromuscular education;Therapeutic exercise;DME and/or AE instruction;Patient/family education;Therapeutic activities    Consulted and Agree with Plan of Care  Patient  Patient will benefit from skilled therapeutic intervention in order to improve the following deficits and impairments:   Body Structure / Function / Physical Skills: ADL, IADL, Pain, Coordination, FMC, UE functional use, Strength       Visit Diagnosis: Muscle weakness (generalized)  Other lack of coordination    Problem List Patient Active Problem List   Diagnosis Date Noted  . Postmenopausal bleeding 09/02/2018    [name], MS, OTR/L 10/21/2018, 5:13 PM  [place] MAIN [facility] SERVICES [address] [place], [place], [address] Phone: [phone]   Fax:  [phone]  Name: DICIE PIPPERT MRN: [id number] Date of Birth: 1965-08-17

## 2018-10-26 ENCOUNTER — Ambulatory Visit: Payer: BC Managed Care – PPO | Admitting: Speech Pathology

## 2018-10-28 ENCOUNTER — Ambulatory Visit: Payer: BC Managed Care – PPO | Attending: Family Medicine | Admitting: Occupational Therapy

## 2018-10-28 ENCOUNTER — Encounter: Payer: Self-pay | Admitting: Occupational Therapy

## 2018-10-28 ENCOUNTER — Other Ambulatory Visit: Payer: Self-pay

## 2018-10-28 DIAGNOSIS — R471 Dysarthria and anarthria: Secondary | ICD-10-CM | POA: Diagnosis present

## 2018-10-28 DIAGNOSIS — R278 Other lack of coordination: Secondary | ICD-10-CM | POA: Insufficient documentation

## 2018-10-28 DIAGNOSIS — R41841 Cognitive communication deficit: Secondary | ICD-10-CM | POA: Insufficient documentation

## 2018-10-28 DIAGNOSIS — M6281 Muscle weakness (generalized): Secondary | ICD-10-CM | POA: Diagnosis present

## 2018-10-28 NOTE — Therapy (Signed)
[place] MAIN [facility] SERVICES [address] [place], [place], [address] Phone: [phone]   Fax:  [phone]  Occupational Therapy Treatment  Patient Details  Name: Heather Simpson MRN: [id number] Date of Birth: 1965/05/29 Referring Provider (OT): Gennette Pac   Encounter Date: 10/28/2018  OT End of Session - 10/28/18 1156    Visit Number  2    Number of Visits  24    Date for OT Re-Evaluation  01/13/19    OT Start Time  1145    OT Stop Time  1230    OT Time Calculation (min)  45 min    Activity Tolerance  Patient tolerated treatment well    Behavior During Therapy  [facility] for tasks assessed/performed       Past Medical History:  Diagnosis Date  . Hypertension   . Stroke [facility])    May 27, 2018    Past Surgical History:  Procedure Laterality Date  . BREAST BIOPSY Left 2015   benign  . LOOP RECORDER INSERTION N/A 09/08/2018   Procedure: LOOP RECORDER INSERTION;  Surgeon: Isaias Cowman, MD;  Location: [place] [facility];  Service: Cardiovascular;  Laterality: N/A;  . uterine ablasion      There were no vitals filed for this visit.  Subjective Assessment - 10/28/18 1153    Subjective   Pt. reports having had a good weekend    Patient is accompanied by:  Family member    Pertinent History  Pt. is a 53 y.o. 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 Pt. was give TPA at onset. Pt. had another episode with an ER visit on 07/27/2018. Pt. with a PMHx of Anxiety, Depression.    Patient Stated Goals  To improve walking, and LUE strength    Currently in Pain?  Yes    Pain Score  7     Pain Location  Groin    Pain Orientation  Right    Pain Descriptors / Indicators  Sore;Aching    Pain Type  Acute pain    Pain Onset  In the past 7 days      OT TREATMENT    Neuro muscular re-education:  Pt. worked on [facility] skills grasping 1" sticks, 1/4" collars, and 1/4"  washers. Pt. worked on storing the objects in the palm, and translatory skills moving the items from the palm of the hand to the tip of the 2nd digit, and thumb. Pt. worked on removing the pegs using bilateral alternating hand patterns.  Therapeutic Exercise:  Pt. worked on pinch strengthening in the left hand for lateral, and 3pt. pinch using yellow, red, green, and blue resistive clips. Pt. worked on placing the clips at various vertical and horizontal angles. Tactile and verbal cues were required for eliciting the desired movement.  Pt. worked on green thearputty ex. For hand strengthening. Exercises included: gross gripping, gross digit extension, thumb abduction, lateral, and 3pt. Pinch strengthening, digit abduction, and thumb opposition. Pt. was provided with pink theraputty, and a visual handout HEP. Pt. was provided with a HEP through [employer]. Pt. was provided with a personalized access code.                           OT Short Term Goals - 10/21/18 1558      OT SHORT TERM GOAL #1   Title  --    Baseline  --  Time  --    Status  --        OT Long Term Goals - 10/21/18 1652      OT LONG TERM GOAL #1   Title  Pt. will increase LUE strength by 32mm grades to assist with ADLs, and IADLs.    Baseline  Eval: LUE functioning.    Time  12    Period  Weeks    Status  New    Target Date  01/13/19      OT LONG TERM GOAL #2   Title  Pt. will increase left grip strength by 10# to be able to hold a pitcher, and pour a beverage.    Baseline  Eval: decreased left grip strength    Time  12    Period  Weeks    Status  New    Target Date  01/13/19      OT LONG TERM GOAL #3   Title  Pt. will increase left pinch strength by 3 pounds in order to be able to be able to open bottles    Baseline  Eval: Pt. has difficulty opening bottles    Time  12    Period  Weeks    Status  New    Target Date  01/13/19      OT LONG TERM GOAL #4   Title  Pt. will indpendenltly  fold laundry    Baseline  Eval: Pt. has difficulty folding laundry.    Time  12    Period  Weeks    Status  New    Target Date  01/13/19            Plan - 10/28/18 1157    Clinical Impression Statement  Pt. reports having pulled the right side of her groin the other day trying to go up a step. Pt. presents with impaired left hand strength, grip strength, motor control and [facility] skills. Pt. continues to work on improving LUE functioning in order to be able to use them during ADLs, and IADL tasks.    Occupational performance deficits (Please refer to evaluation for details):  ADL's;IADL's    Body Structure / Function / Physical Skills  ADL;IADL;Pain;Coordination;FMC;UE functional use;Strength    Rehab Potential  Good    Clinical Decision Making  Several treatment options, min-mod task modification necessary    Comorbidities Affecting Occupational Performance:  May have comorbidities impacting occupational performance    Modification or Assistance to Complete Evaluation   Min-Moderate modification of tasks or assist with assess necessary to complete eval    OT Frequency  2x / week    OT Duration  12 weeks    OT Treatment/Interventions  Self-care/ADL training;Neuromuscular education;Therapeutic exercise;DME and/or AE instruction;Patient/family education;Therapeutic activities    Consulted and Agree with Plan of Care  Patient       Patient will benefit from skilled therapeutic intervention in order to improve the following deficits and impairments:   Body Structure / Function / Physical Skills: ADL, IADL, Pain, Coordination, FMC, UE functional use, Strength       Visit Diagnosis: Muscle weakness (generalized)  Other lack of coordination    Problem List Patient Active Problem List   Diagnosis Date Noted  . Postmenopausal bleeding 09/02/2018    [name], MS, OTR/L 10/28/2018, 12:08 PM  [place] MAIN [facility] SERVICES [address] [place], [place], [address] Phone: [phone]   Fax:  [phone]  Name: Heather Simpson MRN:  [id number] Date of Birth: 1965-06-02

## 2018-11-03 ENCOUNTER — Ambulatory Visit: Payer: BC Managed Care – PPO | Admitting: Occupational Therapy

## 2018-11-05 ENCOUNTER — Ambulatory Visit: Payer: BC Managed Care – PPO | Admitting: Occupational Therapy

## 2018-11-05 ENCOUNTER — Encounter: Payer: Self-pay | Admitting: Occupational Therapy

## 2018-11-05 ENCOUNTER — Ambulatory Visit: Payer: BC Managed Care – PPO | Admitting: Speech Pathology

## 2018-11-05 ENCOUNTER — Other Ambulatory Visit: Payer: Self-pay

## 2018-11-05 DIAGNOSIS — R471 Dysarthria and anarthria: Secondary | ICD-10-CM

## 2018-11-05 DIAGNOSIS — M6281 Muscle weakness (generalized): Secondary | ICD-10-CM | POA: Diagnosis not present

## 2018-11-05 DIAGNOSIS — R278 Other lack of coordination: Secondary | ICD-10-CM

## 2018-11-05 DIAGNOSIS — R41841 Cognitive communication deficit: Secondary | ICD-10-CM

## 2018-11-05 NOTE — Therapy (Signed)
[place] MAIN [facility] SERVICES [address] [place], [place], [address] Phone: [phone]   Fax:  [phone]  Occupational Therapy Treatment  Patient Details  Name: Heather Simpson MRN: [id number] Date of Birth: 1965/08/08 Referring Provider (OT): Gennette Pac   Encounter Date: 11/05/2018  OT End of Session - 11/05/18 1156    Visit Number  3    Number of Visits  24    Date for OT Re-Evaluation  01/13/19    OT Start Time  1150    OT Stop Time  1230    OT Time Calculation (min)  40 min    Activity Tolerance  Patient tolerated treatment well    Behavior During Therapy  [facility] for tasks assessed/performed       Past Medical History:  Diagnosis Date  . Hypertension   . Stroke [facility])    May 27, 2018    Past Surgical History:  Procedure Laterality Date  . BREAST BIOPSY Left 2015   benign  . LOOP RECORDER INSERTION N/A 09/08/2018   Procedure: LOOP RECORDER INSERTION;  Surgeon: Isaias Cowman, MD;  Location: [place] [facility];  Service: Cardiovascular;  Laterality: N/A;  . uterine ablasion      There were no vitals filed for this visit.  Subjective Assessment - 11/05/18 1155    Subjective   Pt. reports having had a good weekend    Pertinent History  Pt. is a 53 y.o. 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 Pt. was give TPA at onset. Pt. had another episode with an ER visit on 07/27/2018. Pt. with a PMHx of Anxiety, Depression.    Currently in Pain?  No/denies      OT TREATMENT    Neuro muscular re-education:  Pt. worked on tasks to sustain lateral pinch on resistive tweezers while grasping and moving 2" toothpick sticks from a horizontal flat position to a vertical position in order to place it in the holder. Pt. changed from using the tweezers to grasping the toothpicks with her left finger tips. Pt. was able to sustain grasp while positioning and extending  the wrist/hand in the necessary alignment needed to place the stick through the top of the holder. Pt. worked on grasping coins from a tabletop surface, placing them into a resistive container, and pushing them through the slot while isolating his 2nd digit. Pt. worked on tying, and untying knots with her bilateral hands incorporating the left hand during the task. Pt. Worked on progressing from thicker rope to thinner thickness.                          OT Education - 11/05/18 1156    Education Details  [facility] skills       OT Short Term Goals - 10/21/18 1558      OT SHORT TERM GOAL #1   Title  --    Baseline  --    Time  --    Status  --        OT Long Term Goals - 10/21/18 1652      OT LONG TERM GOAL #1   Title  Pt. will increase LUE strength by 74mm grades to assist with ADLs, and IADLs.    Baseline  Eval: LUE functioning.    Time  12    Period  Weeks    Status  New  Target Date  01/13/19      OT LONG TERM GOAL #2   Title  Pt. will increase left grip strength by 10# to be able to hold a pitcher, and pour a beverage.    Baseline  Eval: decreased left grip strength    Time  12    Period  Weeks    Status  New    Target Date  01/13/19      OT LONG TERM GOAL #3   Title  Pt. will increase left pinch strength by 3 pounds in order to be able to be able to open bottles    Baseline  Eval: Pt. has difficulty opening bottles    Time  12    Period  Weeks    Status  New    Target Date  01/13/19      OT LONG TERM GOAL #4   Title  Pt. will indpendenltly fold laundry    Baseline  Eval: Pt. has difficulty folding laundry.    Time  12    Period  Weeks    Status  New    Target Date  01/13/19            Plan - 11/05/18 1157    Clinical Impression Statement Pt. reports that the pain in her groin is feeling better today. Pt. reports that she is noticing that her left hand is improving, and she is able to grasp hold of items better, and is able to hold and  spread mulch for a sustained amount of time. Pt. presents with limited LUE strength, motor control, and [facility] skills in order to improve LUE functional use during ADLs, and IADLs.    Occupational performance deficits (Please refer to evaluation for details):  ADL's;IADL's    Body Structure / Function / Physical Skills  ADL;IADL;Pain;Coordination;FMC;UE functional use;Strength    Rehab Potential  Good    Clinical Decision Making  Several treatment options, min-mod task modification necessary    Comorbidities Affecting Occupational Performance:  May have comorbidities impacting occupational performance    Modification or Assistance to Complete Evaluation   Min-Moderate modification of tasks or assist with assess necessary to complete eval    OT Frequency  2x / week    OT Duration  12 weeks    OT Treatment/Interventions  Self-care/ADL training;Neuromuscular education;Therapeutic exercise;DME and/or AE instruction;Patient/family education;Therapeutic activities    Consulted and Agree with Plan of Care  Patient       Patient will benefit from skilled therapeutic intervention in order to improve the following deficits and impairments:   Body Structure / Function / Physical Skills: ADL, IADL, Pain, Coordination, FMC, UE functional use, Strength       Visit Diagnosis: Muscle weakness (generalized)  Other lack of coordination    Problem List Patient Active Problem List   Diagnosis Date Noted  . Postmenopausal bleeding 09/02/2018    Heather Carina, MS,OTR/L 11/05/2018, 12:12 PM  [place] MAIN [facility] SERVICES [address] [place], [place], [address] Phone: [phone]   Fax:  [phone]  Name: Heather Simpson MRN: [id number] Date of Birth: 1965-04-09

## 2018-11-06 ENCOUNTER — Other Ambulatory Visit: Payer: Self-pay

## 2018-11-06 ENCOUNTER — Encounter: Payer: Self-pay | Admitting: Speech Pathology

## 2018-11-06 NOTE — Therapy (Signed)
[place] MAIN [facility] SERVICES [address] [place], [place], [address] Phone: [phone]   Fax:  [phone]  Speech Language Pathology Evaluation  Patient Details  Name: Heather Simpson MRN: [id number] Date of Birth: [birth year] Referring Provider (SLP): Gennette Pac, FNP   Encounter Date: 11/05/2018  End of Session - 11/06/18 1509    Visit Number  1    Number of Visits  17    Date for SLP Re-Evaluation  12/30/18    Authorization Type  [employer] 20 max    Authorization Time Period  Start 11/05/2018    Authorization - Visit Number  1    Authorization - Number of Visits  20    SLP Start Time  1105    SLP Stop Time   1145    SLP Time Calculation (min)  40 min    Activity Tolerance  Patient tolerated treatment well       Past Medical History:  Diagnosis Date  . Hypertension   . Stroke [facility])    May 27, 2018    Past Surgical History:  Procedure Laterality Date  . BREAST BIOPSY Left 2015   benign  . LOOP RECORDER INSERTION N/A 09/08/2018   Procedure: LOOP RECORDER INSERTION;  Surgeon: Isaias Cowman, MD;  Location: [place] [facility];  Service: Cardiovascular;  Laterality: N/A;  . uterine ablasion      There were no vitals filed for this visit.      SLP Evaluation [facility] - 11/06/18 0001      SLP Visit Information   SLP Received On  11/05/18    Referring Provider (SLP)  Gennette Pac, FNP    Onset Date  05/27/2018    Medical Diagnosis  Stroke      Subjective   Subjective  The patient was alert, pleasant, and cooperative throughout the speech therapy evaluation. She initially appeared nervous but became more comfortable over the course of the evaluation.    Patient/Family Stated Goal  The patient would like to "feel like herself again" in terms of restoring her prior speech fluency and vocal quality, improving her word finding abilities, and enhancing her memory skills. She reports difficulty remembering to take her  medications on schedule.      Pain Assessment   Currently in Pain?  No/denies      General Information   HPI  53 year old woman hospitalized 05/27/2018-05/29/2018 for stroke/psychogenic neurological symptoms. CT 07/27/2018 was normal.  Patient reports completing 1 ST session in acute care, 2 home health ST sessions (patient unsatisfied with SLP bedside manner) and taking initiative to find ST exercises online to complete independently. The patient endorses continuing speech impairment as well as cognitive impairment.      Prior Functional Status   Cognitive/Linguistic Baseline  Within functional limits      Cognition   Overall Cognitive Status  Impaired/Different from baseline      Auditory Comprehension   Overall Auditory Comprehension  Appears within functional limits for tasks assessed      Expression   Primary Mode of Expression  Verbal      Verbal Expression   Overall Verbal Expression  Appears within functional limits for tasks assessed      Oral Motor/Sensory Function   Overall Oral Motor/Sensory Function  Impaired      Motor Speech   Overall Motor Speech  Impaired    Respiration  Impaired    Level of Impairment  Sentence    Phonation  Hoarse;Low vocal  intensity    Resonance  Within functional limits    Articulation  Within functional limitis    Intelligibility  Intelligible    Phonation  Impaired    Vocal Abuses  Habitual Hyperphonia    Tension Present  Jaw;Neck;Shoulder    Volume  Soft    Pitch  --   Monotone     Standardized Assessments   Standardized Assessments   --   Motor Speech Evaluation       Motor Speech Evaluation  Face: Minimal left facial droop Lips: ROM and strength within normal limits, mildly decreased planning/coordination for rapid alternating movements.  Tongue: ROM and strength within normal limits, mildly decreased planning/coordination for rapid alternating movements.  Jaw: Within normal limits; observe some tension. Soft palate: Within  normal limits Oral agility: mildly decreased planning/coordination for rapid alternating movements.  Sensory: reports mild left facial numbness Voice: hoarse/strained vocal quality, monotone, low vocal intensity, reduced ability to project voice Respiration:  impaired- sustains unvoiced fricative for only 4 seconds Resonance:  Within normal limits Fluency: initial sound prolongations, hesitations Intelligibility: Fully intelligible, low volume spontaneous speech with min-mod dysfluency Cognition:   Patient reports cognitive impairment, specifically memory  SLP Education - 11/06/18 1509    Education Details  Results and recommendations    Person(s) Educated  Patient    Methods  Explanation    Comprehension  Verbalized understanding         SLP Long Term Goals - 11/06/18 1513      SLP LONG TERM GOAL #1   Title  Patient will demonstrate functional cognitive-communication skills for independent completion of personal responsibilities and leisure activities.    Time  8    Period  Weeks    Status  New    Target Date  12/30/18      SLP LONG TERM GOAL #2   Title  The patient will be independent for abdominal breathing and breath support exercises.    Period  Weeks    Status  New    Target Date  12/30/18      SLP LONG TERM GOAL #3   Title  The patient will minimize vocal tension via resonant voice therapy (or comparable technique) with min SLP cues with 80% accuracy.    Time  8    Period  Weeks    Status  New    Target Date  12/30/18      SLP LONG TERM GOAL #4   Title  The patient will minimize dysfluency using constant flow phonation with 80% accuracy.    Time  8    Period  Weeks    Target Date  12/30/18      SLP LONG TERM GOAL #5   Title  Patient will demonstrate functional cognitive-communication skills for independent completion of personal responsibilities and leisure activities.    Time  8    Period  Weeks    Status  New    Target Date  12/30/18       Plan -  11/06/18 1512    Clinical Impression Statement  At 5 1/2 months post onset left sided weakness and speech changes, the patient is presenting with mild-moderate dysarthria characterized by minimal left facial droop, mildly decreased planning/coordination for rapid alternating movement for speech, mild left face numbness, hoarse/strain vocal quality, impaired pitch/loudness control, impaired  respiratory support/control for speech, and mild-moderate dysfluency. In addition, the patient is reporting cognitive-communication impairments that will be more fully assessed in future sessions.  In  the meantime, the patient will benefit from skilled speech therapy for restorative and compensatory treatment of speech/voice impairments as well as ongoing assessment for cognitive needs.    Speech Therapy Frequency  2x / week    Duration  Other (comment)   8 weeks   Potential to Achieve Goals  Good    Potential Considerations  Ability to learn/carryover information;Previous level of function;Severity of impairments;Cooperation/participation level;Medical prognosis;Family/community support       Patient will benefit from skilled therapeutic intervention in order to improve the following deficits and impairments:   Dysarthria and anarthria - Plan: SLP plan of care cert/re-cert  Cognitive communication deficit - Plan: SLP plan of care cert/re-cert    Problem List Patient Active Problem List   Diagnosis Date Noted  . Postmenopausal bleeding 09/02/2018   Leroy Sea, MS/CCC- SLP  Lou Miner 11/06/2018, 3:30 PM  [place] MAIN [facility] SERVICES [address] [place], [place], [address] Phone: [phone]   Fax:  [phone]  Name: Heather Simpson MRN: [id number] Date of Birth: [birth year]

## 2018-11-10 ENCOUNTER — Ambulatory Visit: Payer: BC Managed Care – PPO | Admitting: Occupational Therapy

## 2018-11-10 ENCOUNTER — Ambulatory Visit: Payer: BC Managed Care – PPO | Admitting: Speech Pathology

## 2018-11-12 ENCOUNTER — Encounter: Payer: Self-pay | Admitting: Speech Pathology

## 2018-11-12 ENCOUNTER — Ambulatory Visit: Payer: BC Managed Care – PPO | Admitting: Occupational Therapy

## 2018-11-12 ENCOUNTER — Ambulatory Visit: Payer: BC Managed Care – PPO | Admitting: Speech Pathology

## 2018-11-12 ENCOUNTER — Encounter: Payer: Self-pay | Admitting: Occupational Therapy

## 2018-11-12 ENCOUNTER — Other Ambulatory Visit: Payer: Self-pay

## 2018-11-12 DIAGNOSIS — M6281 Muscle weakness (generalized): Secondary | ICD-10-CM | POA: Diagnosis not present

## 2018-11-12 DIAGNOSIS — R471 Dysarthria and anarthria: Secondary | ICD-10-CM

## 2018-11-12 DIAGNOSIS — R278 Other lack of coordination: Secondary | ICD-10-CM

## 2018-11-12 NOTE — Therapy (Signed)
[place] MAIN [facility] SERVICES [address] [place], [place], [address] Phone: [phone]   Fax:  [phone]  Occupational Therapy Treatment  Patient Details  Name: Heather Simpson MRN: [id number] Date of Birth: March 21, 1965 Referring Provider (OT): Gennette Pac   Encounter Date: 11/12/2018  OT End of Session - 11/12/18 1214    Visit Number  4    Number of Visits  24    Date for OT Re-Evaluation  01/13/19    OT Start Time  1145    OT Stop Time  1230    OT Time Calculation (min)  45 min    Activity Tolerance  Patient tolerated treatment well    Behavior During Therapy  [facility] for tasks assessed/performed       Past Medical History:  Diagnosis Date  . Hypertension   . Stroke [facility])    May 27, 2018    Past Surgical History:  Procedure Laterality Date  . BREAST BIOPSY Left 2015   benign  . LOOP RECORDER INSERTION N/A 09/08/2018   Procedure: LOOP RECORDER INSERTION;  Surgeon: Isaias Cowman, MD;  Location: [place] [facility];  Service: Cardiovascular;  Laterality: N/A;  . uterine ablasion      There were no vitals filed for this visit.  Subjective Assessment - 11/12/18 1151    Subjective   Pt. reports left knee pain    Pertinent History  Pt. is a 53 y.o. 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 Pt. was give TPA at onset. Pt. had another episode with an ER visit on 07/27/2018. Pt. with a PMHx of Anxiety, Depression.    Limitations  LUE weakness.    Currently in Pain?  Yes    Pain Score  7     Pain Location  Knee    Pain Orientation  Left    Pain Descriptors / Indicators  Aching;Sore       OT TREATMENT    Neuro muscular re-education:  Pt. worked on [facility] skills grasping 1" sticks, 1/4" collars, and 1/4" washers. Pt. worked on storing the objects in the palm, and translatory skills moving the items from the palm of the hand to the tip of the 2nd digit, and  thumb.   Therapeutic Exercise:  Pt. Worked on the [employer] for 8 min. With constant monitoring of the BUEs. Pt. Worked on the forward reverse position for the duration. No Rest breaks were required.  Pt. Worked on level 4.0. Pt. Reported that she really liked working out on the machine, reporting decreased tightness in her LUE, and shoulder following.  Response to Treatment  BP 116/70. Pt. Reports having had dizziness intermittently over the past few days. She is planning to have a Holter monitor placed. Pt. is making progress with inner hand, and thumb movements when grasping objects with her thumb, and 2nd digit. Pt. is using her hand more during fishing. Pt. continues to have limited LUE strength, Grip strength, pinch strength. and translatory movements. Pt. presents with increased compensation proximally with hiking of the shoulder during [facility] skills. Pt. requires cues. Pt. continues to work on improving LUE strength, and [facility] skills to be able to hold a pitcher, open bottles, and fold laundry.                      OT Education - 11/12/18 1214    Education Details  [facility] skills    Person(s)  Educated  Patient    Methods  Explanation    Comprehension  Verbalized understanding;Returned demonstration       OT Short Term Goals - 10/21/18 1558      OT SHORT TERM GOAL #1   Title  --    Baseline  --    Time  --    Status  --        OT Long Term Goals - 10/21/18 1652      OT LONG TERM GOAL #1   Title  Pt. will increase LUE strength by 37mm grades to assist with ADLs, and IADLs.    Baseline  Eval: LUE functioning.    Time  12    Period  Weeks    Status  New    Target Date  01/13/19      OT LONG TERM GOAL #2   Title  Pt. will increase left grip strength by 10# to be able to hold a pitcher, and pour a beverage.    Baseline  Eval: decreased left grip strength    Time  12    Period  Weeks    Status  New    Target Date  01/13/19      OT LONG TERM GOAL #3   Title  Pt.  will increase left pinch strength by 3 pounds in order to be able to be able to open bottles    Baseline  Eval: Pt. has difficulty opening bottles    Time  12    Period  Weeks    Status  New    Target Date  01/13/19      OT LONG TERM GOAL #4   Title  Pt. will indpendenltly fold laundry    Baseline  Eval: Pt. has difficulty folding laundry.    Time  12    Period  Weeks    Status  New    Target Date  01/13/19            Plan - 11/12/18 1214    Clinical Impression Statement  Pt. is making progress with inner hand, and thumb movements when grasping objects with her thumb, and 2nd digit. Pt. is using her hand more during fishing. Pt. continues to have limited LUE strength, Grip strength, pinch strength. and translatory movements. Pt. presents with increased compensation proximally with hiking of the shoulder during [facility] skills. Pt. requires cues. Pt. continues to work on improving LUE strength, and [facility] skills to be able to hold a pitcher, open bottles, and fold laundry.    Occupational performance deficits (Please refer to evaluation for details):  ADL's;IADL's    Body Structure / Function / Physical Skills  ADL;IADL;Pain;Coordination;FMC;UE functional use;Strength    Rehab Potential  Good    Clinical Decision Making  Several treatment options, min-mod task modification necessary    Comorbidities Affecting Occupational Performance:  May have comorbidities impacting occupational performance    Modification or Assistance to Complete Evaluation   Min-Moderate modification of tasks or assist with assess necessary to complete eval    OT Frequency  2x / week    OT Duration  12 weeks    OT Treatment/Interventions  Self-care/ADL training;Neuromuscular education;Therapeutic exercise;DME and/or AE instruction;Patient/family education;Therapeutic activities    Consulted and Agree with Plan of Care  Patient       Patient will benefit from skilled therapeutic intervention in order to improve the  following deficits and impairments:   Body Structure / Function / Physical Skills: ADL, IADL, Pain,  Coordination, Gibson, UE functional use, Strength       Visit Diagnosis: Muscle weakness (generalized)  Other lack of coordination    Problem List Patient Active Problem List   Diagnosis Date Noted  . Postmenopausal bleeding 09/02/2018    [name], MS, OTR/L 11/12/2018, 12:30 PM  [place] MAIN [facility] SERVICES [address] [place], [place], [address] Phone: [phone]   Fax:  [phone]  Name: Heather Simpson MRN: [id number] Date of Birth: 1965-05-09

## 2018-11-12 NOTE — Therapy (Signed)
[place] MAIN [facility] SERVICES [address] [place], [place], [address] Phone: [phone]   Fax:  [phone]  Speech Language Pathology Treatment  Patient Details  Name: Heather Simpson MRN: [id number] Date of Birth: 06/23/1965 Referring Provider (SLP): Gennette Pac, FNP   Encounter Date: 11/12/2018  End of Session - 11/12/18 1640    Visit Number  2    Number of Visits  17    Date for SLP Re-Evaluation  12/30/18    Authorization Type  [employer] 20 max    Authorization Time Period  Start 11/05/2018    Authorization - Visit Number  2    Authorization - Number of Visits  20    SLP Start Time  1100    SLP Stop Time   1150    SLP Time Calculation (min)  50 min    Activity Tolerance  Patient tolerated treatment well       Past Medical History:  Diagnosis Date  . Hypertension   . Stroke [facility])    May 27, 2018    Past Surgical History:  Procedure Laterality Date  . BREAST BIOPSY Left 2015   benign  . LOOP RECORDER INSERTION N/A 09/08/2018   Procedure: LOOP RECORDER INSERTION;  Surgeon: Isaias Cowman, MD;  Location: [place] [facility];  Service: Cardiovascular;  Laterality: N/A;  . uterine ablasion      There were no vitals filed for this visit.  Subjective Assessment - 11/12/18 1634    Subjective  The patient was alert, cooperative, and pleasant throughout the therapy session. She shared about questions she intends to ask her physician at an upcoming appointment.            ADULT SLP TREATMENT - 11/12/18 0001      General Information   Behavior/Cognition  Alert;Cooperative;Pleasant mood      Treatment Provided   Treatment provided  Cognitive-Linquistic      Pain Assessment   Pain Assessment  No/denies pain      Cognitive-Linquistic Treatment   Treatment focused on  Cognition;Voice;Patient/family/caregiver education    Skilled Treatment  The Cerebellar Cognitive Affective/Schmahmann Syndrome Scale (CCAS-Scale)  was administered to formally assess the patient's current level of cognitive functioning (see addendum for scores). Patient education was provided pertaining to the physiology of breath support for speech and voice. A series of exercises/stretches targeting improved posture were explained and demonstrated. Additionally, a series of exercises targeting abdominal breathing and improved breath support were explained and demonstrated. The patient verbalized understanding and performed return demonstrations of all exercises. The patient was provided with informational handouts and a written home exercise program.      Assessment / Recommendations / Plan   Plan  Continue with current plan of care      Progression Toward Goals   Progression toward goals  Progressing toward goals     Semantic Fluency (name as many animals as you can in 1 minute): 11/26 Phonemic Fluency (name as many words as you can in 1 minute that start with the letter F): 12/19 Category Switching (name a type of vegetable and then a type of profession/job, and alternate between the two lists): 10/15 Verbal Registration (immediate recall of 5 unrelated words): 5/5 Digit Span Forward (repeat numbers in forward order): 5/8 Digit Span Backward (repeat numbers in reverse order): 5/6 Cube (Draw): Patient unable to complete Cube (Copy): 12/15 Verbal Recall /(delayed recall of the 5 unrelated words presented earlier): 15/15 Similarities (state how words pairs  are conceptually alike): 6/8 Alternating Attention/Memory (following directions under two randomly alternating conditions): 2/2  SLP Education - 11/12/18 1639    Education Details  Focus on breathing into your belly and try not to let your chest and shoulders rise.    Person(s) Educated  Patient    Methods  Explanation;Demonstration    Comprehension  Verbalized understanding;Returned demonstration         SLP Long Term Goals - 11/06/18 1513      SLP LONG TERM GOAL #1   Title   Patient will demonstrate functional cognitive-communication skills for independent completion of personal responsibilities and leisure activities.    Time  8    Period  Weeks    Status  New    Target Date  12/30/18      SLP LONG TERM GOAL #2   Title  The patient will be independent for abdominal breathing and breath support exercises.    Period  Weeks    Status  New    Target Date  12/30/18      SLP LONG TERM GOAL #3   Title  The patient will minimize vocal tension via resonant voice therapy (or comparable technique) with min SLP cues with 80% accuracy.    Time  8    Period  Weeks    Status  New    Target Date  12/30/18      SLP LONG TERM GOAL #4   Title  The patient will minimize dysfluency using constant flow phonation with 80% accuracy.    Time  8    Period  Weeks    Target Date  12/30/18      SLP LONG TERM GOAL #5   Title  Patient will demonstrate functional cognitive-communication skills for independent completion of personal responsibilities and leisure activities.    Time  8    Period  Weeks    Status  New    Target Date  12/30/18       Plan - 11/12/18 1641    Clinical Impression Statement  The patient presents with mild-moderate dysarthria characterized by minimal left facial droop, mildly decreased planning/coordination for rapid alternating movement for speech, mild left face numbness, hoarse/strain vocal quality, impaired pitch/loudness control, impaired respiratory support/control for speech, mild-moderate dysfluency, and mild-moderate word finding deficits. She reports reading comprehension difficulties as well. The patient is motivated to improve her cognitive-communication skills and expresses intent to comply with the speech therapy home exercise program provided during today's treatment session.    Speech Therapy Frequency  2x / week    Duration  Other (comment)    Treatment/Interventions  SLP instruction and feedback;Patient/family education;Compensatory  strategies    Potential to Achieve Goals  Good    Potential Considerations  Ability to learn/carryover information;Previous level of function;Severity of impairments;Cooperation/participation level;Medical prognosis;Family/community support    SLP Home Exercise Plan  Provided    Consulted and Agree with Plan of Care  Patient       Patient will benefit from skilled therapeutic intervention in order to improve the following deficits and impairments:   Dysarthria and anarthria    Problem List Patient Active Problem List   Diagnosis Date Noted  . Postmenopausal bleeding 09/02/2018   Tumeka Chimenti A. Francis Dowse., Graduate Clinician Vella Kohler 11/12/2018, 4:45 PM  [place] MAIN [facility] SERVICES [address] [place], [place], [address] Phone: [phone]   Fax:  [phone]   Name: Heather Simpson MRN: [id number] Date of Birth:  03/29/1965 

## 2018-11-16 ENCOUNTER — Ambulatory Visit: Payer: BC Managed Care – PPO | Admitting: Occupational Therapy

## 2018-11-16 ENCOUNTER — Ambulatory Visit: Payer: BC Managed Care – PPO | Admitting: Speech Pathology

## 2018-11-18 ENCOUNTER — Ambulatory Visit: Payer: BC Managed Care – PPO | Admitting: Occupational Therapy

## 2018-11-18 ENCOUNTER — Ambulatory Visit: Payer: BC Managed Care – PPO | Admitting: Speech Pathology

## 2018-11-23 ENCOUNTER — Other Ambulatory Visit: Payer: Self-pay

## 2018-11-23 ENCOUNTER — Encounter: Payer: Self-pay | Admitting: Speech Pathology

## 2018-11-23 ENCOUNTER — Ambulatory Visit: Payer: BC Managed Care – PPO | Admitting: Occupational Therapy

## 2018-11-23 ENCOUNTER — Ambulatory Visit: Payer: BC Managed Care – PPO | Admitting: Speech Pathology

## 2018-11-23 ENCOUNTER — Encounter: Payer: Self-pay | Admitting: Occupational Therapy

## 2018-11-23 DIAGNOSIS — R41841 Cognitive communication deficit: Secondary | ICD-10-CM

## 2018-11-23 DIAGNOSIS — R471 Dysarthria and anarthria: Secondary | ICD-10-CM

## 2018-11-23 DIAGNOSIS — M6281 Muscle weakness (generalized): Secondary | ICD-10-CM | POA: Diagnosis not present

## 2018-11-23 DIAGNOSIS — R278 Other lack of coordination: Secondary | ICD-10-CM

## 2018-11-23 NOTE — Therapy (Signed)
[place] MAIN [facility] SERVICES [address] [place], [place], [address] Phone: [phone]   Fax:  [phone]  Speech Language Pathology Treatment  Patient Details  Name: Heather Simpson MRN: [id number] Date of Birth: May 18, 1965 Referring Provider (SLP): Gennette Pac, FNP   Encounter Date: 11/23/2018  End of Session - 11/23/18 1216    Visit Number  3    Number of Visits  17    Date for SLP Re-Evaluation  12/30/18    Authorization Type  [employer] 20 max    Authorization Time Period  Start 11/05/2018    Authorization - Visit Number  3    Authorization - Number of Visits  20    SLP Start Time  1000    SLP Stop Time   1050    SLP Time Calculation (min)  50 min    Activity Tolerance  Patient tolerated treatment well       Past Medical History:  Diagnosis Date  . Hypertension   . Stroke [facility])    May 27, 2018    Past Surgical History:  Procedure Laterality Date  . BREAST BIOPSY Left 2015   benign  . LOOP RECORDER INSERTION N/A 09/08/2018   Procedure: LOOP RECORDER INSERTION;  Surgeon: Isaias Cowman, MD;  Location: [place] [facility];  Service: Cardiovascular;  Laterality: N/A;  . uterine ablasion      There were no vitals filed for this visit.  Subjective Assessment - 11/23/18 1215    Subjective  The patient reports many stressors in her life at this time            ADULT SLP TREATMENT - 11/23/18 0001      General Information   Behavior/Cognition  Alert;Cooperative;Pleasant mood      Treatment Provided   Treatment provided  Cognitive-Linquistic      Pain Assessment   Pain Assessment  No/denies pain      Cognitive-Linquistic Treatment   Treatment focused on  Cognition;Voice;Patient/family/caregiver education    Skilled Treatment  COGNITITION: Patient given handout and verbal teaching regarding helpful memory strategies.  She is reporting major stressors in her life but appears to be coping well.  VOICE:   Patient given education in resonant voice technique and is able to maintain oral resonance with nasal words, phrases and sentences with 80% accuracy in imitation.  The patient experienced mild dysfluency during this task.      Assessment / Recommendations / Plan   Plan  Continue with current plan of care      Progression Toward Goals   Progression toward goals  Progressing toward goals       SLP Education - 11/23/18 1216    Education Details  Resonant voice         SLP Long Term Goals - 11/06/18 1513      SLP LONG TERM GOAL #1   Title  Patient will demonstrate functional cognitive-communication skills for independent completion of personal responsibilities and leisure activities.    Time  8    Period  Weeks    Status  New    Target Date  12/30/18      SLP LONG TERM GOAL #2   Title  The patient will be independent for abdominal breathing and breath support exercises.    Period  Weeks    Status  New    Target Date  12/30/18      SLP LONG TERM GOAL #3   Title  The patient  will minimize vocal tension via resonant voice therapy (or comparable technique) with min SLP cues with 80% accuracy.    Time  8    Period  Weeks    Status  New    Target Date  12/30/18      SLP LONG TERM GOAL #4   Title  The patient will minimize dysfluency using constant flow phonation with 80% accuracy.    Time  8    Period  Weeks    Target Date  12/30/18      SLP LONG TERM GOAL #5   Title  Patient will demonstrate functional cognitive-communication skills for independent completion of personal responsibilities and leisure activities.    Time  8    Period  Weeks    Status  New    Target Date  12/30/18       Plan - 11/23/18 1216    Clinical Impression Statement  The patient is able to improve vocal quality with resonant voice techniques and states that she can feel and hear correct vs. incorrect productions.    Speech Therapy Frequency  2x / week    Duration  Other (comment)     Treatment/Interventions  SLP instruction and feedback;Patient/family education;Compensatory strategies    Potential to Achieve Goals  Good    Potential Considerations  Ability to learn/carryover information;Previous level of function;Severity of impairments;Cooperation/participation level;Medical prognosis;Family/community support    SLP Home Exercise Plan  Provided    Consulted and Agree with Plan of Care  Patient       Patient will benefit from skilled therapeutic intervention in order to improve the following deficits and impairments:   Dysarthria and anarthria  Cognitive communication deficit    Problem List Patient Active Problem List   Diagnosis Date Noted  . Postmenopausal bleeding 09/02/2018   Leroy Sea, MS/CCC- SLP  Lou Miner 11/23/2018, 12:17 PM  [place] MAIN [facility] SERVICES [address] [place], [place], [address] Phone: [phone]   Fax:  [phone]   Name: Heather Simpson MRN: [id number] Date of Birth: [birth year]

## 2018-11-23 NOTE — Therapy (Signed)
[place] MAIN [facility] SERVICES [address] [place], [place], [address] Phone: [phone]   Fax:  [phone]  Occupational Therapy Treatment  Patient Details  Name: Heather Simpson MRN: [id number] Date of Birth: 08-31-1965 Referring Provider (OT): Gennette Pac   Encounter Date: 11/23/2018  OT End of Session - 11/23/18 1116    Visit Number  5    Number of Visits  24    Date for OT Re-Evaluation  01/13/19    Authorization Type  Progress reporting period starting 10/21/2018    OT Start Time  1107    OT Stop Time  1145    OT Time Calculation (min)  38 min    Activity Tolerance  Patient tolerated treatment well    Behavior During Therapy  [facility] for tasks assessed/performed       Past Medical History:  Diagnosis Date  . Hypertension   . Stroke [facility])    May 27, 2018    Past Surgical History:  Procedure Laterality Date  . BREAST BIOPSY Left 2015   benign  . LOOP RECORDER INSERTION N/A 09/08/2018   Procedure: LOOP RECORDER INSERTION;  Surgeon: Isaias Cowman, MD;  Location: [place] [facility];  Service: Cardiovascular;  Laterality: N/A;  . uterine ablasion      There were no vitals filed for this visit.  Subjective Assessment - 11/23/18 1111    Subjective   Pt. reports left knee pain    Patient is accompanied by:  Family member    Pertinent History  Pt. is a 53 y.o. 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 Pt. was give TPA at onset. Pt. had another episode with an ER visit on 07/27/2018. Pt. with a PMHx of Anxiety, Depression.    Currently in Pain?  Yes      OT TREATMENT    Neuro muscular re-education:  Pt. worked on left hand grasping 1" resistive cubes alternating thumb opposition to the tip of the 2nd through 5th digits while the board is placed at a vertical angle. Pt. worked on pressing the cubes back into place while alternating isolated 2nd through  5th digit extension. Pt. worked on grasping 1/2" flat marbles, and translatory skills moving them through her hand from the palm to the tip of her 2nd digit, and thumb. Pt. worked on bilateral [facility] skills manipulating untying medium, and small sized knots.    Therapeutic Exercise:  Pt. performed gross gripping with grip strengthener. Pt. worked on sustaining grip while grasping pegs and reaching at various heights. The gripper was was placed at 23.4# of grip strength force.  Response to Treatment  Pt. is making steady progress with Left UE strength, [facility] skills, and translatory movements of the left hand. Pt. continues to present with limited left grip strength, pinch strength, and [facility] skills when handling objects duirng ADLs, and IADLs at home. Pt. continues to work on these skills to improve LUE engagement during ADLs, and IADL.                     OT Education - 11/23/18 1116    Education Details  [place] skills    Person(s) Educated  Patient    Methods  Explanation    Comprehension  Verbalized understanding;Returned demonstration       OT Short Term Goals - 10/21/18 1558      OT SHORT TERM GOAL #1   Title  --  Baseline  --    Time  --    Status  --        OT Long Term Goals - 10/21/18 1652      OT LONG TERM GOAL #1   Title  Pt. will increase LUE strength by 72mm grades to assist with ADLs, and IADLs.    Baseline  Eval: LUE functioning.    Time  12    Period  Weeks    Status  New    Target Date  01/13/19      OT LONG TERM GOAL #2   Title  Pt. will increase left grip strength by 10# to be able to hold a pitcher, and pour a beverage.    Baseline  Eval: decreased left grip strength    Time  12    Period  Weeks    Status  New    Target Date  01/13/19      OT LONG TERM GOAL #3   Title  Pt. will increase left pinch strength by 3 pounds in order to be able to be able to open bottles    Baseline  Eval: Pt. has difficulty opening bottles    Time  12     Period  Weeks    Status  New    Target Date  01/13/19      OT LONG TERM GOAL #4   Title  Pt. will indpendenltly fold laundry    Baseline  Eval: Pt. has difficulty folding laundry.    Time  12    Period  Weeks    Status  New    Target Date  01/13/19            Plan - 11/23/18 1118    Clinical Impression Statement  Pt. is making steady progress with Left UE strength, [facility] skills, and translatory movements of the left hand. Pt. continues to present with limited left grip strength, pinch strength, and [facility] skills when handling objects duirng ADLs, and IADLs at home. Pt. continues to work on these skills to improve LUE engagement during ADLs, and IADL.    Occupational performance deficits (Please refer to evaluation for details):  ADL's;IADL's    Body Structure / Function / Physical Skills  ADL;IADL;Pain;Coordination;FMC;UE functional use;Strength    Rehab Potential  Good    Clinical Decision Making  Several treatment options, min-mod task modification necessary    Comorbidities Affecting Occupational Performance:  May have comorbidities impacting occupational performance    Modification or Assistance to Complete Evaluation   Min-Moderate modification of tasks or assist with assess necessary to complete eval    OT Frequency  2x / week    OT Duration  12 weeks    OT Treatment/Interventions  Self-care/ADL training;Neuromuscular education;Therapeutic exercise;DME and/or AE instruction;Patient/family education;Therapeutic activities    Consulted and Agree with Plan of Care  Patient       Patient will benefit from skilled therapeutic intervention in order to improve the following deficits and impairments:   Body Structure / Function / Physical Skills: ADL, IADL, Pain, Coordination, FMC, UE functional use, Strength       Visit Diagnosis: Muscle weakness (generalized)  Other lack of coordination    Problem List Patient Active Problem List   Diagnosis Date Noted  . Postmenopausal  bleeding 09/02/2018    [name], MS, OTR/L 11/23/2018, 11:38 AM  [place] MAIN [facility] SERVICES [address] [place], [place], [address] Phone: [phone]   Fax:  [phone]  Name: Heather Simpson MRN: [id number] Date of Birth: February 21, 1966

## 2018-11-25 ENCOUNTER — Ambulatory Visit: Payer: BC Managed Care – PPO | Admitting: Occupational Therapy

## 2018-11-25 ENCOUNTER — Ambulatory Visit: Payer: BC Managed Care – PPO | Admitting: Speech Pathology

## 2018-11-30 ENCOUNTER — Encounter: Payer: BC Managed Care – PPO | Admitting: Occupational Therapy

## 2018-12-02 ENCOUNTER — Encounter: Payer: Self-pay | Admitting: Speech Pathology

## 2018-12-02 ENCOUNTER — Other Ambulatory Visit: Payer: Self-pay

## 2018-12-02 ENCOUNTER — Ambulatory Visit: Payer: BC Managed Care – PPO | Attending: Family Medicine | Admitting: Occupational Therapy

## 2018-12-02 ENCOUNTER — Ambulatory Visit: Payer: BC Managed Care – PPO | Admitting: Speech Pathology

## 2018-12-02 ENCOUNTER — Encounter: Payer: Self-pay | Admitting: Occupational Therapy

## 2018-12-02 DIAGNOSIS — R471 Dysarthria and anarthria: Secondary | ICD-10-CM

## 2018-12-02 DIAGNOSIS — M6281 Muscle weakness (generalized): Secondary | ICD-10-CM | POA: Diagnosis not present

## 2018-12-02 DIAGNOSIS — R278 Other lack of coordination: Secondary | ICD-10-CM | POA: Insufficient documentation

## 2018-12-02 DIAGNOSIS — R41841 Cognitive communication deficit: Secondary | ICD-10-CM | POA: Diagnosis present

## 2018-12-02 NOTE — Therapy (Signed)
[place] MAIN [facility] SERVICES [address] [place], [place], [address] Phone: [phone]   Fax:  [phone]  Speech Language Pathology Treatment  Patient Details  Name: Heather Simpson MRN: [id number] Date of Birth: [birth year] Referring Provider (SLP): Gennette Pac, FNP   Encounter Date: 12/02/2018  End of Session - 12/02/18 1635    Visit Number  4    Number of Visits  17    Date for SLP Re-Evaluation  12/30/18    Authorization Type  [employer] 20 max    Authorization Time Period  Start 11/05/2018    Authorization - Visit Number  4    Authorization - Number of Visits  20    SLP Start Time  1400    SLP Stop Time   1450    SLP Time Calculation (min)  50 min    Activity Tolerance  Patient tolerated treatment well       Past Medical History:  Diagnosis Date  . Hypertension   . Stroke [facility])    May 27, 2018    Past Surgical History:  Procedure Laterality Date  . BREAST BIOPSY Left 2015   benign  . LOOP RECORDER INSERTION N/A 09/08/2018   Procedure: LOOP RECORDER INSERTION;  Surgeon: Isaias Cowman, MD;  Location: [place] [facility];  Service: Cardiovascular;  Laterality: N/A;  . uterine ablasion      There were no vitals filed for this visit.  Subjective Assessment - 12/02/18 1632    Subjective  Pt pleasant and cooperative with unfamiliar therapist    Currently in Pain?  Yes            ADULT SLP TREATMENT - 12/02/18 0001      General Information   Behavior/Cognition  Alert;Cooperative;Pleasant mood      Treatment Provided   Treatment provided  Cognitive-Linquistic      Pain Assessment   Pain Assessment  No/denies pain      Cognitive-Linquistic Treatment   Treatment focused on  Cognition;Voice    Skilled Treatment  Pt was seen for skilled ST intervention targeting goals for improved cognition and voice. SLP reviewed and practiced breath support exercises, neck stretches, and resonant voice exercises with pt  to facilitate understanding and carry over of treatment tasks. Sustained /a/ average 9.5 seconds today. s/z ratio: 0.85.. Written exercises were provided to pt.       Assessment / Recommendations / Plan   Plan  Continue with current plan of care      Progression Toward Goals   Progression toward goals  Progressing toward goals       SLP Education - 12/02/18 1633    Education Details  Worksheets on breath support, stretches, and resonant voice exercises reviewed and provided    Person(s) Educated  Patient    Methods  Explanation;Demonstration;Handout    Comprehension  Verbalized understanding;Returned demonstration         SLP Long Term Goals - 11/06/18 1513      SLP LONG TERM GOAL #1   Title  Patient will demonstrate functional cognitive-communication skills for independent completion of personal responsibilities and leisure activities.    Time  8    Period  Weeks    Status  New    Target Date  12/30/18      SLP LONG TERM GOAL #2   Title  The patient will be independent for abdominal breathing and breath support exercises.    Period  Weeks    Status  New    Target Date  12/30/18      SLP LONG TERM GOAL #3   Title  The patient will minimize vocal tension via resonant voice therapy (or comparable technique) with min SLP cues with 80% accuracy.    Time  8    Period  Weeks    Status  New    Target Date  12/30/18      SLP LONG TERM GOAL #4   Title  The patient will minimize dysfluency using constant flow phonation with 80% accuracy.    Time  8    Period  Weeks    Target Date  12/30/18      SLP LONG TERM GOAL #5   Title  Patient will demonstrate functional cognitive-communication skills for independent completion of personal responsibilities and leisure activities.    Time  8    Period  Weeks    Status  New    Target Date  12/30/18       Plan - 12/02/18 1636    Clinical Impression Statement  Pt attentive to instruction regarding methods of increasing awareness of  tension and stress, and was able to apply strategies during the session. Continued ST intervention would be beneficial to maximize improvement in voicing.    Speech Therapy Frequency  2x / week    Duration  Other (comment)    Treatment/Interventions  SLP instruction and feedback;Patient/family education;Compensatory strategies    Potential Considerations  Ability to learn/carryover information;Previous level of function;Severity of impairments;Cooperation/participation level;Medical prognosis;Family/community support    SLP Home Exercise Plan  Provided    Consulted and Agree with Plan of Care  Patient       Patient will benefit from skilled therapeutic intervention in order to improve the following deficits and impairments:   Dysarthria and anarthria  Cognitive communication deficit    Problem List Patient Active Problem List   Diagnosis Date Noted  . Postmenopausal bleeding 09/02/2018    B. Quentin Ore, [facility] [place], CCC-SLP Speech Language Pathologist  Shonna Chock 12/02/2018, 4:39 PM  [place] MAIN [facility] SERVICES [address] [place], [place], [address] Phone: [phone]   Fax:  [phone]   Name: Heather Simpson MRN: [id number] Date of Birth: 1965-07-17

## 2018-12-02 NOTE — Therapy (Signed)
[place] MAIN [facility] SERVICES [address] [place], [place], [address] Phone: [phone]   Fax:  [phone]  Occupational Therapy Treatment  Patient Details  Name: Heather Simpson MRN: [id number] Date of Birth: [birth year] Referring Provider (OT): Gennette Pac   Encounter Date: 12/02/2018  OT End of Session - 12/02/18 1313    Visit Number  6    Number of Visits  24    Date for OT Re-Evaluation  01/13/19    Authorization Type  Progress reporting period starting 10/21/2018    OT Start Time  1100    OT Stop Time  1145    OT Time Calculation (min)  45 min    Activity Tolerance  Patient tolerated treatment well    Behavior During Therapy  [facility] for tasks assessed/performed       Past Medical History:  Diagnosis Date  . Hypertension   . Stroke [facility])    May 27, 2018    Past Surgical History:  Procedure Laterality Date  . BREAST BIOPSY Left 2015   benign  . LOOP RECORDER INSERTION N/A 09/08/2018   Procedure: LOOP RECORDER INSERTION;  Surgeon: Isaias Cowman, MD;  Location: [place] [facility];  Service: Cardiovascular;  Laterality: N/A;  . uterine ablasion      There were no vitals filed for this visit.  Subjective Assessment - 12/02/18 1313    Subjective   Pt. reports left knee pain    Patient is accompanied by:  Family member    Pertinent History  Pt. is a 53 y.o. 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 Pt. was give TPA at onset. Pt. had another episode with an ER visit on 07/27/2018. Pt. with a PMHx of Anxiety, Depression.    Currently in Pain?  No/denies      OT TREATMENT    Neuro muscular re-education:  Pt. worked on left hand [facility] skills grasping, and manipulating 1/2" pegs. Pt. Worked on storing the the pegs in her left hand, and translatory skills moving the pegs through her hand to the tip of her 2nd digit, and thumb to place the into the  pegboard. Pt. worked on grasping, and storing the pegs when removing them from the pegboard. Pt. worked on grasping, and manipulating 1/2" washers from a magnetic dish using point grasp pattern. Pt. worked on reaching up, stabilizing, and sustaining shoulder elevation while placing the washer over a small precise target on vertical dowels positioned at various angles. Pt. worked on grasping, storing, and stacking checkers at the tabletop, working to [employer] 1/2" objects.  Therapeutic Exercise:  Pt. worked on the [employer] for 4 min. with constant monitoring of the BUEs. Pt. worked on changing, and alternating forward reverse position every 2 min. rest breaks were required.   Response to Treatment  Pt. is making progress overall, and is now grasping objects better at home. Pt. presents with limited activity tolerance skills, and tolerated 4 min. On the [place]. Pt. continues to present with limited LUE strength, motor control, and [facility] skills, and continues to work on these skills in order to improve hand function skills during  ADLs, and IADL functioning.                    OT Education - 12/02/18 1313    Education Details  [facility] skills    Person(s) Educated  Patient    Methods  Explanation  Comprehension  Verbalized understanding;Returned demonstration       OT Short Term Goals - 10/21/18 1558      OT SHORT TERM GOAL #1   Title  --    Baseline  --    Time  --    Status  --        OT Long Term Goals - 10/21/18 1652      OT LONG TERM GOAL #1   Title  Pt. will increase LUE strength by 73mm grades to assist with ADLs, and IADLs.    Baseline  Eval: LUE functioning.    Time  12    Period  Weeks    Status  New    Target Date  01/13/19      OT LONG TERM GOAL #2   Title  Pt. will increase left grip strength by 10# to be able to hold a pitcher, and pour a beverage.    Baseline  Eval: decreased left grip strength    Time  12    Period  Weeks    Status  New    Target Date   01/13/19      OT LONG TERM GOAL #3   Title  Pt. will increase left pinch strength by 3 pounds in order to be able to be able to open bottles    Baseline  Eval: Pt. has difficulty opening bottles    Time  12    Period  Weeks    Status  New    Target Date  01/13/19      OT LONG TERM GOAL #4   Title  Pt. will indpendenltly fold laundry    Baseline  Eval: Pt. has difficulty folding laundry.    Time  12    Period  Weeks    Status  New    Target Date  01/13/19            Plan - 12/02/18 1314    Clinical Impression Statement Pt. is making progress overall, and is now grasping objects better at home. Pt. presents with limited activity tolerance skills. Pt. continues to present with limited LUE strength, motor control, and [facility] skills, and continues to work on these skills in order to improve hand function skills during  ADLs, and IADL functioning.    Occupational performance deficits (Please refer to evaluation for details):  ADL's;IADL's    Body Structure / Function / Physical Skills  ADL;IADL;Pain;Coordination;FMC;UE functional use;Strength    Rehab Potential  Good    Clinical Decision Making  Several treatment options, min-mod task modification necessary    Comorbidities Affecting Occupational Performance:  May have comorbidities impacting occupational performance    Modification or Assistance to Complete Evaluation   Min-Moderate modification of tasks or assist with assess necessary to complete eval    OT Frequency  2x / week    OT Duration  12 weeks    OT Treatment/Interventions  Self-care/ADL training;Neuromuscular education;Therapeutic exercise;DME and/or AE instruction;Patient/family education;Therapeutic activities    Consulted and Agree with Plan of Care  Patient       Patient will benefit from skilled therapeutic intervention in order to improve the following deficits and impairments:   Body Structure / Function / Physical Skills: ADL, IADL, Pain, Coordination, FMC, UE  functional use, Strength       Visit Diagnosis: Muscle weakness (generalized)  Other lack of coordination    Problem List Patient Active Problem List   Diagnosis Date Noted  . Postmenopausal bleeding 09/02/2018  [name], MS, OTR/L 12/02/2018, 1:28 PM  [place] MAIN [facility] SERVICES [address] [place], [place], [address] Phone: [phone]   Fax:  [phone]  Name: Heather Simpson MRN: [id number] Date of Birth: Jul 24, 1965

## 2018-12-07 ENCOUNTER — Other Ambulatory Visit: Payer: Self-pay

## 2018-12-07 ENCOUNTER — Encounter: Payer: Self-pay | Admitting: Occupational Therapy

## 2018-12-07 ENCOUNTER — Ambulatory Visit: Payer: BC Managed Care – PPO | Admitting: Speech Pathology

## 2018-12-07 ENCOUNTER — Ambulatory Visit: Payer: BC Managed Care – PPO | Admitting: Occupational Therapy

## 2018-12-07 DIAGNOSIS — R471 Dysarthria and anarthria: Secondary | ICD-10-CM

## 2018-12-07 DIAGNOSIS — R41841 Cognitive communication deficit: Secondary | ICD-10-CM

## 2018-12-07 DIAGNOSIS — M6281 Muscle weakness (generalized): Secondary | ICD-10-CM

## 2018-12-07 DIAGNOSIS — R278 Other lack of coordination: Secondary | ICD-10-CM

## 2018-12-07 NOTE — Therapy (Signed)
[place] MAIN [facility] SERVICES [address] [place], [place], [address] Phone: [phone]   Fax:  [phone]  Occupational Therapy Treatment  Patient Details  Name: Heather Simpson MRN: [id number] Date of Birth: May 04, 1965 Referring Provider (OT): Gennette Pac   Encounter Date: 12/07/2018  OT End of Session - 12/07/18 1307    Visit Number  7    Number of Visits  24    Date for OT Re-Evaluation  01/13/19    Authorization Type  Progress reporting period starting 10/21/2018    OT Start Time  1300    OT Stop Time  1345    OT Time Calculation (min)  45 min    Activity Tolerance  Patient tolerated treatment well    Behavior During Therapy  [facility] for tasks assessed/performed       Past Medical History:  Diagnosis Date  . Hypertension   . Stroke [facility])    May 27, 2018    Past Surgical History:  Procedure Laterality Date  . BREAST BIOPSY Left 2015   benign  . LOOP RECORDER INSERTION N/A 09/08/2018   Procedure: LOOP RECORDER INSERTION;  Surgeon: Isaias Cowman, MD;  Location: [place] [facility];  Service: Cardiovascular;  Laterality: N/A;  . uterine ablasion      There were no vitals filed for this visit.  Subjective Assessment - 12/07/18 1306    Subjective   Pt. reports left LE tightness, and pain.    Patient is accompanied by:  Family member    Pertinent History  Pt. is a 53 y.o. 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 Pt. was give TPA at onset. Pt. had another episode with an ER visit on 07/27/2018. Pt. with a PMHx of Anxiety, Depression.    Currently in Pain?  No/denies    Pain Score  6     Pain Location  Leg    Pain Orientation  Left    Pain Descriptors / Indicators  Tightness      OT TREATMENT    Neuro muscular re-education:  Pt. worked on grasping, and manipulating 1/2" washers from a magnetic dish using point grasp pattern. Pt. worked on  reaching up, stabilizing, and sustaining shoulder elevation while placing the washer over a small precise target on vertical dowels positioned at various angles. Pt. worked on [facility] skills grasping 1" sticks, 1/4" collars, and 1/4" washers. Pt. worked on removing the sticks while alternating hand movements, and increasing speed. Pt. required cues. Pt. worked on storing the objects in the palm, and translatory skills moving the items from the palm of the hand to the tip of the 2nd digit, and thumb. Pt. worked on removing the pegs using bilateral alternating hand patterns. Pt. performed [facility] tasks using the Grooved pegboard. Pt. worked on grasping the grooved pegs from a horizontal position, and moving the pegs to a vertical position in the hand to prepare for placing them in the grooved slot.   Response to Treatment  Pt. reports that she had headaches over this past weekend. Pt. quit smoking 9 days ago. Pt. continues to work on improving BUE strength, and [facility] skills. Pt. reports that the pink theraputty exercises are going well for her at home. Pt. reports that she uses red/pink bands that she bought with a therapy ball. Pt. reports that bands bother her. Pt.. reports that she has a wrist support brace from that she got from urgent care  last year that she wore during work. Pt. reports that she doesn't know where it is. Pt. continues to present with limited left hand [facility] skills, and continues to work on these skills to improve LUE functioning.                    OT Education - 12/07/18 1307    Education Details  [facility] skills    Person(s) Educated  Patient    Methods  Explanation    Comprehension  Verbalized understanding;Returned demonstration       OT Short Term Goals - 10/21/18 1558      OT SHORT TERM GOAL #1   Title  --    Baseline  --    Time  --    Status  --        OT Long Term Goals - 10/21/18 1652      OT LONG TERM GOAL #1   Title  Pt. will increase LUE strength by 52mm  grades to assist with ADLs, and IADLs.    Baseline  Eval: LUE functioning.    Time  12    Period  Weeks    Status  New    Target Date  01/13/19      OT LONG TERM GOAL #2   Title  Pt. will increase left grip strength by 10# to be able to hold a pitcher, and pour a beverage.    Baseline  Eval: decreased left grip strength    Time  12    Period  Weeks    Status  New    Target Date  01/13/19      OT LONG TERM GOAL #3   Title  Pt. will increase left pinch strength by 3 pounds in order to be able to be able to open bottles    Baseline  Eval: Pt. has difficulty opening bottles    Time  12    Period  Weeks    Status  New    Target Date  01/13/19      OT LONG TERM GOAL #4   Title  Pt. will indpendenltly fold laundry    Baseline  Eval: Pt. has difficulty folding laundry.    Time  12    Period  Weeks    Status  New    Target Date  01/13/19            Plan - 12/07/18 1309    Clinical Impression Statement Pt. reports that she had headaches over this past weekend. Pt. quit smoking 9 days ago. Pt. continues to work on improving BUE strength, and [facility] skills. Pt. reports that the pink theraputty exercises are going well for her at home. Pt. reports that she uses red/pink bands that she bought with a therapy ball. Pt. reports that bands bother her. Pt.. reports that she has a wrist support brace from that she got from urgent care last year that she wore during work. Pt. reports that she doesn't know where it is. Pt. continues to present with limited left hand [facility] skills, and continues to work on these skills to improve LUE functioning.    Occupational performance deficits (Please refer to evaluation for details):  ADL's;IADL's    Body Structure / Function / Physical Skills  ADL;IADL;Pain;Coordination;FMC;UE functional use;Strength    Rehab Potential  Good    Clinical Decision Making  Several treatment options, min-mod task modification necessary    Comorbidities Affecting Occupational  Performance:  May have  comorbidities impacting occupational performance    Modification or Assistance to Complete Evaluation   Min-Moderate modification of tasks or assist with assess necessary to complete eval    OT Frequency  2x / week    OT Duration  12 weeks    OT Treatment/Interventions  Self-care/ADL training;Neuromuscular education;Therapeutic exercise;DME and/or AE instruction;Patient/family education;Therapeutic activities    Consulted and Agree with Plan of Care  Patient       Patient will benefit from skilled therapeutic intervention in order to improve the following deficits and impairments:   Body Structure / Function / Physical Skills: ADL, IADL, Pain, Coordination, FMC, UE functional use, Strength       Visit Diagnosis: Other lack of coordination  Muscle weakness (generalized)    Problem List Patient Active Problem List   Diagnosis Date Noted  . Postmenopausal bleeding 09/02/2018    [name], MS, OTR/L 12/07/2018, 1:25 PM  [place] MAIN [facility] SERVICES [address] [place], [place], [address] Phone: [phone]   Fax:  [phone]  Name: VIRGLE GAUDET MRN: [id number] Date of Birth: 1965-05-20

## 2018-12-08 ENCOUNTER — Encounter: Payer: Self-pay | Admitting: Speech Pathology

## 2018-12-08 NOTE — Therapy (Signed)
[place] MAIN [facility] SERVICES [address] [place], [place], [address] Phone: [phone]   Fax:  [phone]  Speech Language Pathology Treatment  Patient Details  Name: Heather Simpson MRN: [id number] Date of Birth: Apr 02, 1965 Referring Provider (SLP): Gennette Pac, FNP   Encounter Date: 12/07/2018  End of Session - 12/08/18 1534    Visit Number  5    Number of Visits  17    Date for SLP Re-Evaluation  12/30/18    Authorization Type  [employer] 20 max    Authorization Time Period  Start 11/05/2018    Authorization - Visit Number  5    Authorization - Number of Visits  20    SLP Start Time  1400    SLP Stop Time   1450    SLP Time Calculation (min)  50 min    Activity Tolerance  Patient tolerated treatment well       Past Medical History:  Diagnosis Date  . Hypertension   . Stroke [facility])    May 27, 2018    Past Surgical History:  Procedure Laterality Date  . BREAST BIOPSY Left 2015   benign  . LOOP RECORDER INSERTION N/A 09/08/2018   Procedure: LOOP RECORDER INSERTION;  Surgeon: Isaias Cowman, MD;  Location: [place] [facility];  Service: Cardiovascular;  Laterality: N/A;  . uterine ablasion      There were no vitals filed for this visit.  Subjective Assessment - 12/08/18 1533    Subjective  pt is eager to improve her vocal quality            ADULT SLP TREATMENT - 12/08/18 0001      General Information   Behavior/Cognition  Alert;Cooperative;Pleasant mood      Treatment Provided   Treatment provided  Cognitive-Linquistic      Pain Assessment   Pain Assessment  No/denies pain      Cognitive-Linquistic Treatment   Treatment focused on  Cognition;Voice    Skilled Treatment  The patient was provided with written and verbal teaching regarding neck, shoulder, tongue, and throat stretches exercises to promote relaxed phonation. The patient was provided with written and verbal teaching regarding breath support  exercises.  The patient was provided with verbal, written and recorded instruction in resonant voice exercises:  Hum- Sustained, Hum- Siren, hum- Vowels, Hum- Descending glides, Hum- Ascending glides, Hum- word level. The patient is able to achieve more oral resonance for monotone exercises but has significant difficulty with changing pitch      Assessment / Recommendations / Plan   Plan  Continue with current plan of care      Progression Toward Goals   Progression toward goals  Progressing toward goals       SLP Education - 12/08/18 1534    Education Details  Resonant voice, pitch glides    Person(s) Educated  Patient    Methods  Explanation;Demonstration;Handout    Comprehension  Verbalized understanding;Need further instruction         SLP Long Term Goals - 11/06/18 1513      SLP LONG TERM GOAL #1   Title  Patient will demonstrate functional cognitive-communication skills for independent completion of personal responsibilities and leisure activities.    Time  8    Period  Weeks    Status  New    Target Date  12/30/18      SLP LONG TERM GOAL #2   Title  The patient will be independent for  abdominal breathing and breath support exercises.    Period  Weeks    Status  New    Target Date  12/30/18      SLP LONG TERM GOAL #3   Title  The patient will minimize vocal tension via resonant voice therapy (or comparable technique) with min SLP cues with 80% accuracy.    Time  8    Period  Weeks    Status  New    Target Date  12/30/18      SLP LONG TERM GOAL #4   Title  The patient will minimize dysfluency using constant flow phonation with 80% accuracy.    Time  8    Period  Weeks    Target Date  12/30/18      SLP LONG TERM GOAL #5   Title  Patient will demonstrate functional cognitive-communication skills for independent completion of personal responsibilities and leisure activities.    Time  8    Period  Weeks    Status  New    Target Date  12/30/18       Plan -  12/08/18 1535    Clinical Impression Statement  Patient able to improve vocal quality with resonant voice therapy to improve oral resonance and decrease laryngeal strain.  She is not able to change pitch.    Speech Therapy Frequency  2x / week    Duration  Other (comment)    Treatment/Interventions  SLP instruction and feedback;Patient/family education;Compensatory strategies    Potential to Achieve Goals  Good    Potential Considerations  Ability to learn/carryover information;Previous level of function;Severity of impairments;Cooperation/participation level;Medical prognosis;Family/community support    SLP Home Exercise Plan  Provided    Consulted and Agree with Plan of Care  Patient       Patient will benefit from skilled therapeutic intervention in order to improve the following deficits and impairments:   Cognitive communication deficit  Dysarthria and anarthria    Problem List Patient Active Problem List   Diagnosis Date Noted  . Postmenopausal bleeding 09/02/2018   Leroy Sea, MS/CCC- SLP  Lou Miner 12/08/2018, 3:36 PM  [place] MAIN [facility] SERVICES [address] [place], [place], [address] Phone: [phone]   Fax:  [phone]   Name: Heather Simpson MRN: [id number] Date of Birth: Jun 17, 1965

## 2018-12-09 ENCOUNTER — Ambulatory Visit: Payer: BC Managed Care – PPO | Admitting: Occupational Therapy

## 2018-12-09 ENCOUNTER — Ambulatory Visit: Payer: BC Managed Care – PPO | Admitting: Speech Pathology

## 2018-12-14 ENCOUNTER — Ambulatory Visit: Payer: BC Managed Care – PPO | Admitting: Speech Pathology

## 2018-12-14 ENCOUNTER — Ambulatory Visit: Payer: BC Managed Care – PPO | Admitting: Physical Therapy

## 2018-12-16 ENCOUNTER — Ambulatory Visit: Payer: BC Managed Care – PPO | Admitting: Physical Therapy

## 2018-12-16 ENCOUNTER — Ambulatory Visit: Payer: BC Managed Care – PPO | Admitting: Speech Pathology

## 2018-12-21 ENCOUNTER — Ambulatory Visit: Payer: BC Managed Care – PPO | Admitting: Occupational Therapy

## 2018-12-21 ENCOUNTER — Ambulatory Visit: Payer: BC Managed Care – PPO | Admitting: Speech Pathology

## 2018-12-23 ENCOUNTER — Ambulatory Visit: Payer: BC Managed Care – PPO | Admitting: Occupational Therapy

## 2018-12-23 ENCOUNTER — Ambulatory Visit: Payer: BC Managed Care – PPO | Admitting: Speech Pathology

## 2018-12-29 ENCOUNTER — Ambulatory Visit: Payer: BC Managed Care – PPO | Admitting: Occupational Therapy

## 2018-12-31 ENCOUNTER — Ambulatory Visit: Payer: BC Managed Care – PPO | Admitting: Speech Pathology

## 2018-12-31 ENCOUNTER — Encounter: Payer: BC Managed Care – PPO | Admitting: Speech Pathology

## 2019-01-05 ENCOUNTER — Ambulatory Visit: Payer: BC Managed Care – PPO | Admitting: Occupational Therapy

## 2019-01-07 ENCOUNTER — Ambulatory Visit: Payer: BC Managed Care – PPO | Admitting: Speech Pathology

## 2019-01-07 ENCOUNTER — Ambulatory Visit: Payer: BC Managed Care – PPO | Admitting: Occupational Therapy

## 2019-01-12 ENCOUNTER — Encounter: Payer: BC Managed Care – PPO | Admitting: Occupational Therapy

## 2019-01-14 ENCOUNTER — Encounter: Payer: BC Managed Care – PPO | Admitting: Speech Pathology

## 2019-01-15 IMAGING — MG MM DIGITAL DIAGNOSTIC UNILAT*R* W/ TOMO W/ CAD
6 series · 6 of 18 positions shown · non-contrast
Comparison: Previous exam(s).

CLINICAL DATA: Screening recall for possible right breast mass.

EXAM:
DIGITAL DIAGNOSTIC UNILATERAL RIGHT MAMMOGRAM WITH CAD AND TOMO

[R MLO synth-2D (1 of 2)]
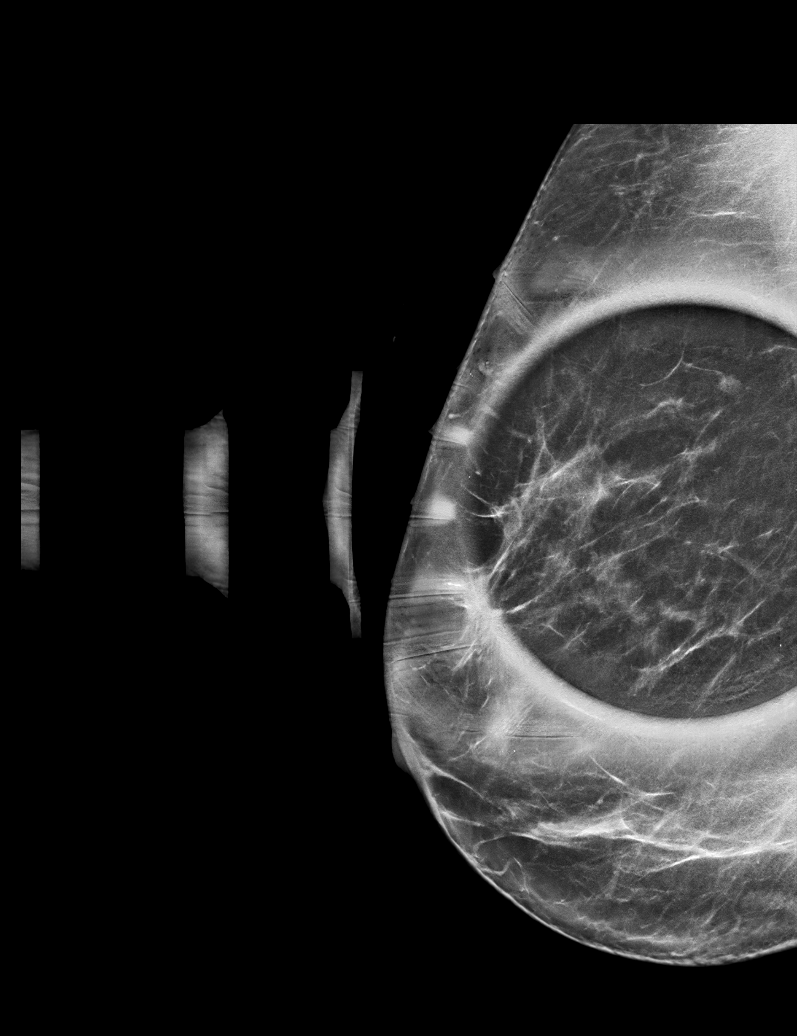

[R MLO synth-2D (2 of 2)]
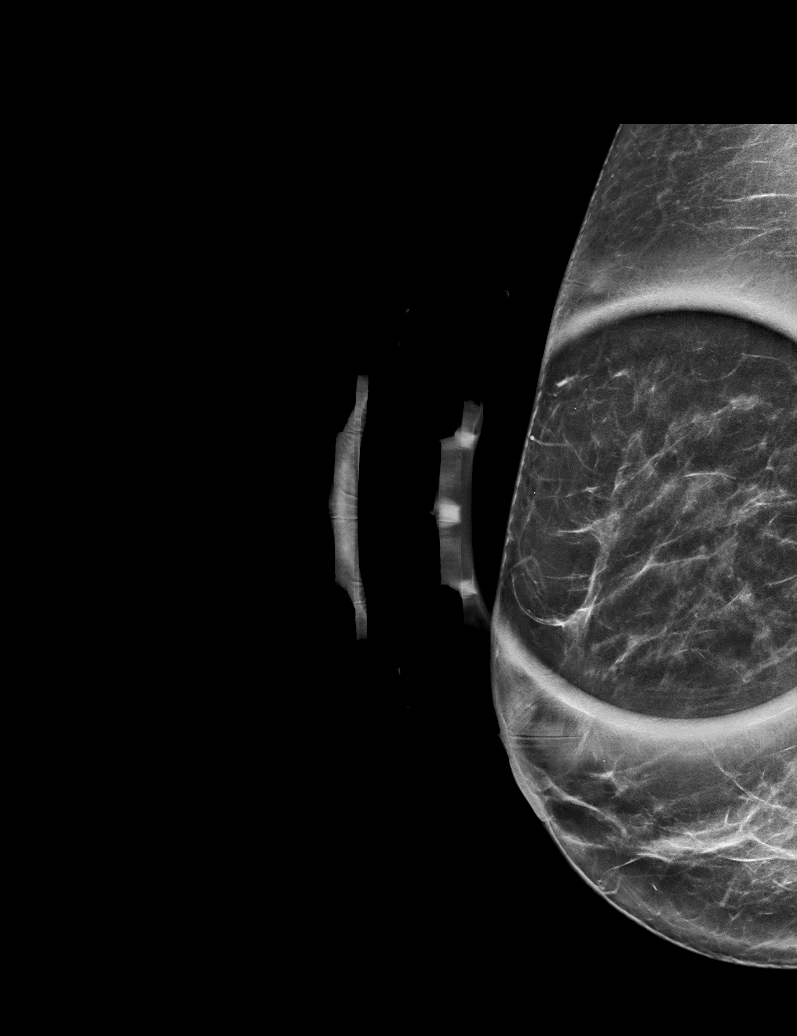

[R CC synth-2D]
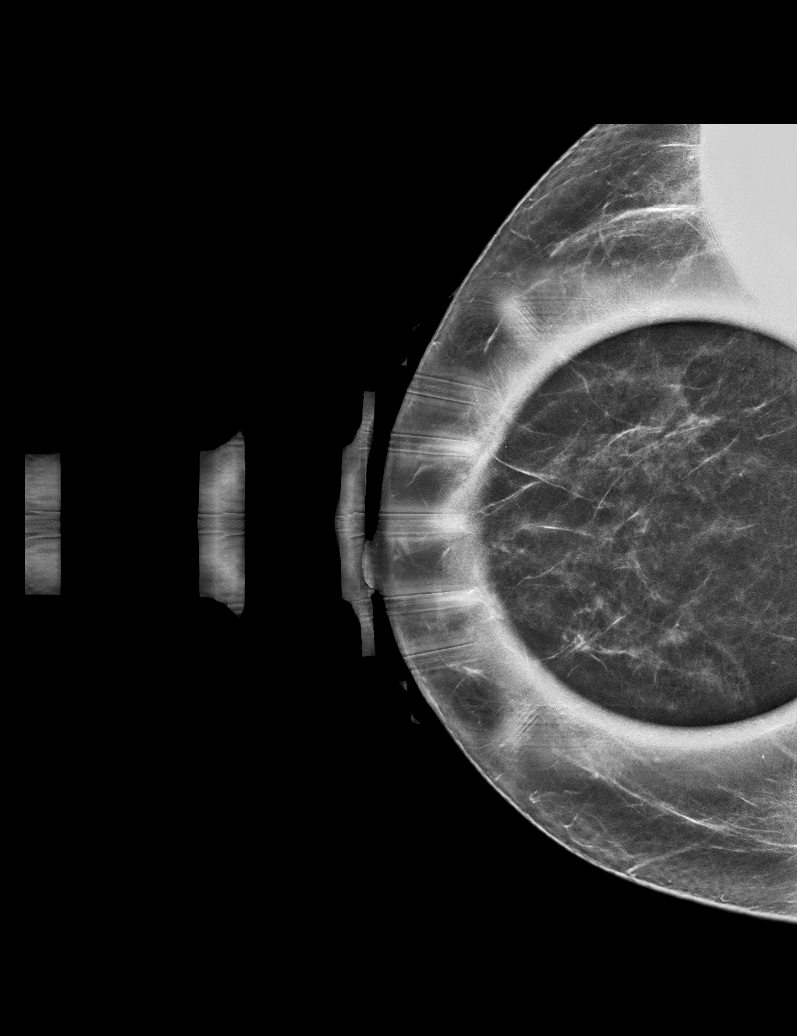

[R MLO tomo (1 of 2) · tomo slice 31/62.0]
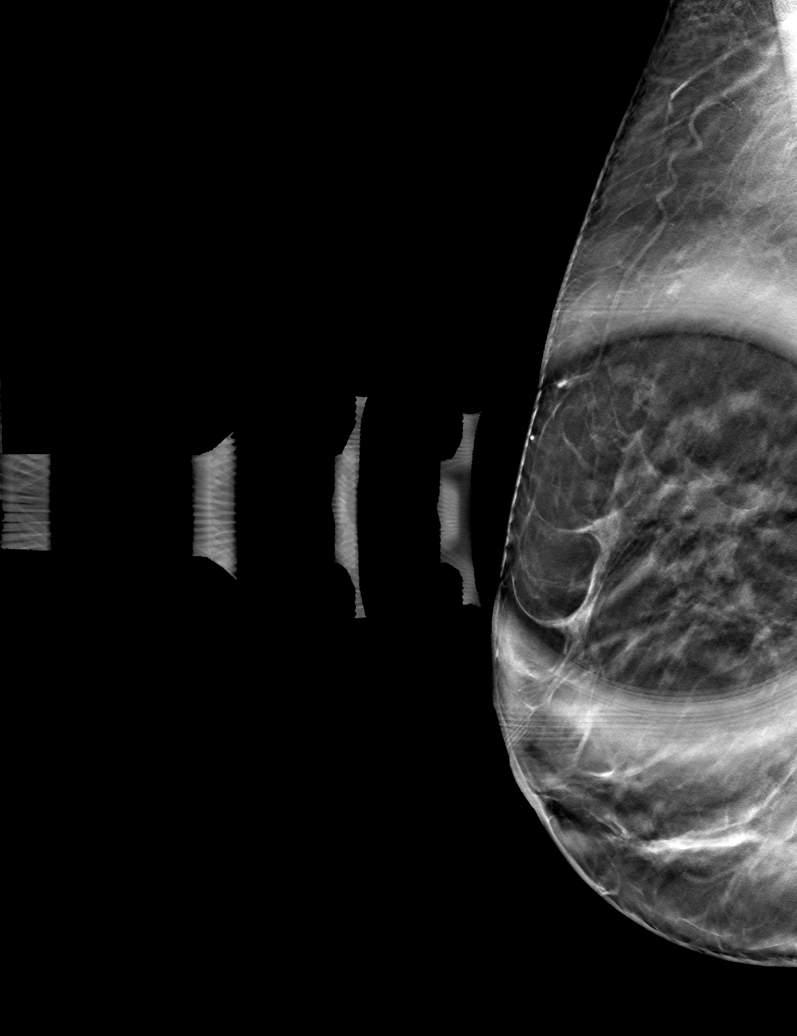

[R CC tomo · tomo slice 29/58.0]
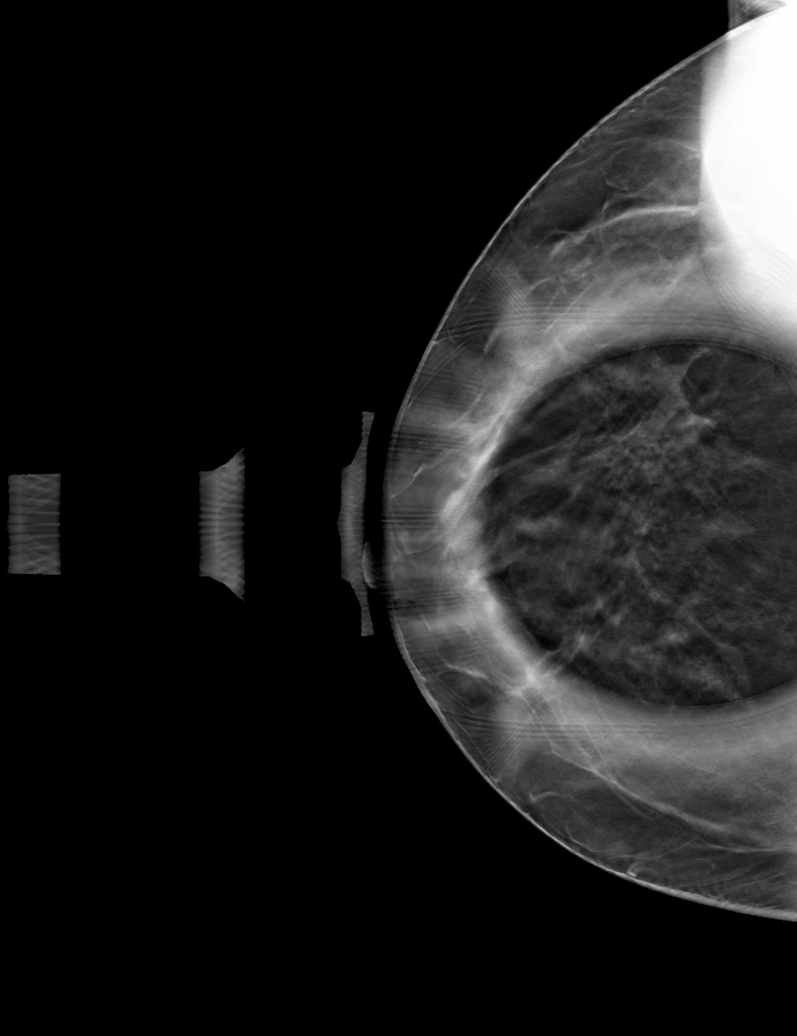

[R MLO tomo (2 of 2) · tomo slice 35/70.0]
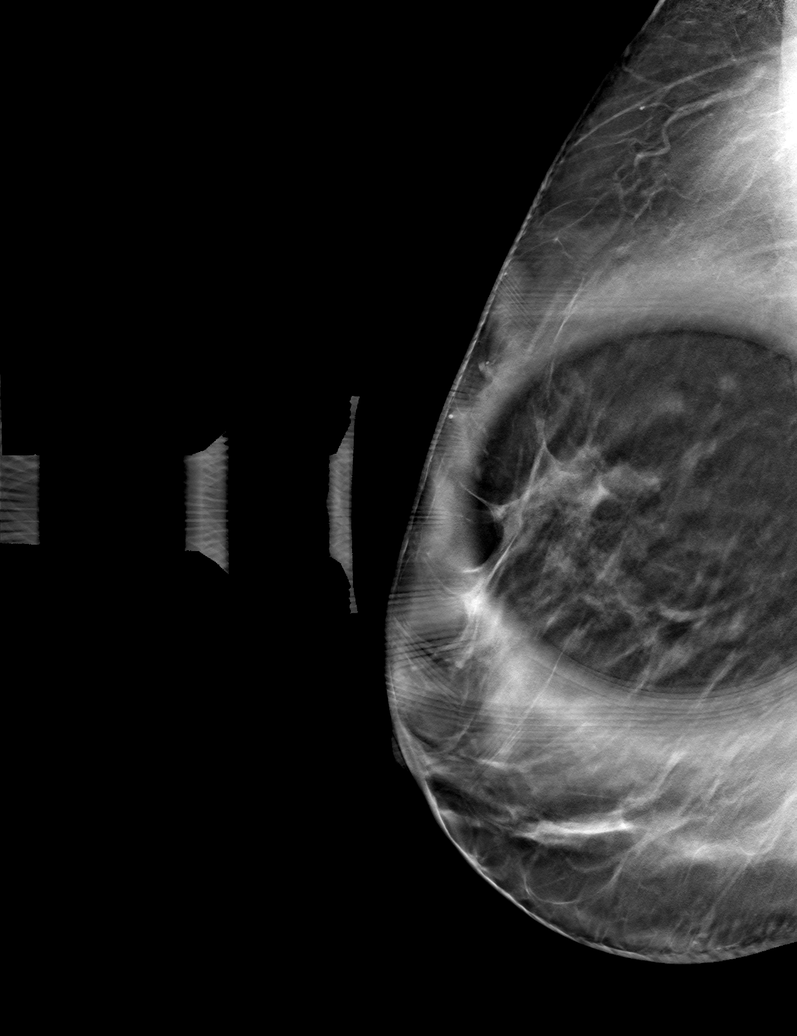

[6 of 18 positions shown; findings below may reference images not displayed]

ACR Breast Density Category c: The breast tissue is heterogeneously
dense, which may obscure small masses.
FINDINGS: Spot compression CC and MLO tomograms were performed of the right
breast. The initially questioned possible right breast mass resolves
on the additional imaging with findings compatible with overlapping
fibroglandular tissue. There is no mammographic evidence of
malignancy in the right breast.

Mammographic images were processed with CAD.
IMPRESSION: No mammographic evidence of malignancy in the right breast.

RECOMMENDATION:
Recommend annual routine screening mammography, due June 2018.

I have discussed the findings and recommendations with the patient.
Results were also provided in writing at the conclusion of the
visit. If applicable, a reminder letter will be sent to the patient
regarding the next appointment.

BI-RADS CATEGORY  1: Negative.

## 2019-01-19 ENCOUNTER — Encounter: Payer: BC Managed Care – PPO | Admitting: Occupational Therapy

## 2019-01-19 ENCOUNTER — Encounter: Payer: BC Managed Care – PPO | Admitting: Speech Pathology

## 2019-01-26 ENCOUNTER — Encounter: Payer: BC Managed Care – PPO | Admitting: Occupational Therapy

## 2019-01-26 ENCOUNTER — Encounter: Payer: BC Managed Care – PPO | Admitting: Speech Pathology

## 2019-01-28 ENCOUNTER — Encounter: Payer: BC Managed Care – PPO | Admitting: Speech Pathology

## 2019-01-28 ENCOUNTER — Encounter: Payer: BC Managed Care – PPO | Admitting: Occupational Therapy

## 2019-02-01 ENCOUNTER — Encounter: Payer: BC Managed Care – PPO | Admitting: Speech Pathology

## 2019-02-03 ENCOUNTER — Encounter: Payer: BC Managed Care – PPO | Admitting: Speech Pathology

## 2019-02-03 ENCOUNTER — Encounter: Payer: BC Managed Care – PPO | Admitting: Occupational Therapy

## 2019-02-09 ENCOUNTER — Encounter: Payer: BC Managed Care – PPO | Admitting: Speech Pathology

## 2019-02-09 ENCOUNTER — Encounter: Payer: BC Managed Care – PPO | Admitting: Occupational Therapy

## 2019-02-11 ENCOUNTER — Encounter: Payer: BC Managed Care – PPO | Admitting: Occupational Therapy

## 2019-02-11 ENCOUNTER — Encounter: Payer: BC Managed Care – PPO | Admitting: Speech Pathology

## 2019-02-16 ENCOUNTER — Encounter: Payer: BC Managed Care – PPO | Admitting: Occupational Therapy

## 2019-02-16 ENCOUNTER — Encounter: Payer: BC Managed Care – PPO | Admitting: Speech Pathology

## 2019-02-23 ENCOUNTER — Encounter: Payer: BC Managed Care – PPO | Admitting: Speech Pathology

## 2019-02-23 ENCOUNTER — Encounter: Payer: BC Managed Care – PPO | Admitting: Occupational Therapy

## 2019-02-25 ENCOUNTER — Encounter: Payer: BC Managed Care – PPO | Admitting: Occupational Therapy

## 2019-02-25 ENCOUNTER — Encounter: Payer: BC Managed Care – PPO | Admitting: Speech Pathology

## 2019-05-24 ENCOUNTER — Ambulatory Visit: Payer: BC Managed Care – PPO | Attending: Internal Medicine

## 2019-05-24 DIAGNOSIS — Z23 Encounter for immunization: Secondary | ICD-10-CM

## 2019-05-24 NOTE — Progress Notes (Signed)
   Covid-19 Vaccination Clinic  Name:  Heather Simpson    MRN: [id number] DOB: 1965/03/10  05/24/2019  [name] was observed post Covid-19 immunization for 15 minutes without incident. She was provided with Vaccine Information Sheet and instruction to access the V-Safe system.   [name] was instructed to call 911 with any severe reactions post vaccine: Marland Kitchen Difficulty breathing  . Swelling of face and throat  . A fast heartbeat  . A bad rash all over body  . Dizziness and weakness   Immunizations Administered    Name Date Dose VIS Date Route   Pfizer COVID-19 Vaccine 05/24/2019  1:29 PM 0.3 mL 02/05/2019 Intramuscular   Manufacturer: [place]   Lot: [id number]   [place]: [id number]

## 2019-06-14 ENCOUNTER — Ambulatory Visit: Payer: BC Managed Care – PPO | Attending: Internal Medicine

## 2019-06-14 DIAGNOSIS — Z23 Encounter for immunization: Secondary | ICD-10-CM

## 2019-06-14 NOTE — Progress Notes (Signed)
   Covid-19 Vaccination Clinic  Name:  Heather Simpson    MRN: [id number] DOB: 17-Jun-1965  06/14/2019  [name] was observed post Covid-19 immunization for 15 minutes without incident. She was provided with Vaccine Information Sheet and instruction to access the V-Safe system.   [name] was instructed to call 911 with any severe reactions post vaccine: Marland Kitchen Difficulty breathing  . Swelling of face and throat  . A fast heartbeat  . A bad rash all over body  . Dizziness and weakness   Immunizations Administered    Name Date Dose VIS Date Route   Pfizer COVID-19 Vaccine 06/14/2019 10:47 AM 0.3 mL 04/21/2018 Intramuscular   Manufacturer: [employer]   Lot: [id number]   [place]: [id number]

## 2019-06-16 ENCOUNTER — Ambulatory Visit: Payer: BC Managed Care – PPO | Admitting: Physical Therapy

## 2019-06-16 ENCOUNTER — Other Ambulatory Visit: Payer: Self-pay

## 2019-06-21 ENCOUNTER — Ambulatory Visit: Payer: BC Managed Care – PPO | Admitting: Physical Therapy

## 2019-06-23 ENCOUNTER — Ambulatory Visit: Payer: BC Managed Care – PPO | Admitting: Physical Therapy

## 2019-06-28 ENCOUNTER — Encounter: Payer: BC Managed Care – PPO | Admitting: Physical Therapy

## 2019-07-01 ENCOUNTER — Other Ambulatory Visit: Payer: Self-pay

## 2019-07-01 ENCOUNTER — Other Ambulatory Visit: Payer: Self-pay | Admitting: Obstetrics and Gynecology

## 2019-07-01 ENCOUNTER — Ambulatory Visit (INDEPENDENT_AMBULATORY_CARE_PROVIDER_SITE_OTHER): Payer: BC Managed Care – PPO

## 2019-07-01 ENCOUNTER — Encounter: Payer: BC Managed Care – PPO | Admitting: Physical Therapy

## 2019-07-01 ENCOUNTER — Telehealth: Payer: Self-pay

## 2019-07-01 DIAGNOSIS — R102 Pelvic and perineal pain: Secondary | ICD-10-CM

## 2019-07-01 DIAGNOSIS — R350 Frequency of micturition: Secondary | ICD-10-CM

## 2019-07-01 LAB — POCT URINALYSIS DIPSTICK
Appearance: NORMAL
Bilirubin, UA: NEGATIVE
Blood, UA: NEGATIVE
Glucose, UA: NEGATIVE
Ketones, UA: NEGATIVE
Leukocytes, UA: NEGATIVE
Nitrite, UA: NEGATIVE
Odor: NORMAL
Protein, UA: NEGATIVE
Spec Grav, UA: 1.015 (ref 1.010–1.025)
Urobilinogen, UA: 0.2 E.U./dL
pH, UA: 5 (ref 5.0–8.0)

## 2019-07-01 NOTE — Telephone Encounter (Signed)
Patient reports UTI symptoms. She's had in the past. She's having bloating, pressure, trouble urinating, urinating too much. She's drinking a lot of water and taking azo. It's been going on for 3 days. She's in a lot of pain/pressure. Requesting abx rx to [facility] [phone]

## 2019-07-01 NOTE — Telephone Encounter (Signed)
Spoke w/pt. Advised office urinalysis did not show UTI. Urine is being sent for culture. Patient mentioned having GI sypmtoms also (diarrhea) She also mentioned she recently tried some Advil Dual Action (tyelenol/ibuprofen combo) her symptoms came after this. Advised to reach out to PCP again to let them know of her negative urinalysis and give details of her GI symptoms to see if PCP can assist with this or refer her to GI if needed.

## 2019-07-01 NOTE — Progress Notes (Signed)
Patient presents with UTI symptoms.  She's having bloating, pressure, trouble urinating, urinating too much. She's drinking a lot of water and taking azo. It's been going on for 3 days.

## 2019-07-01 NOTE — Telephone Encounter (Signed)
Spoke w/patient. Advised need to be seen for nurse visit/urine droop off to verify UTI and send urine for culture. Apt scheduled for 2pm today. She did call her PCP, but hasn't heard back from them.

## 2019-07-03 LAB — URINE CULTURE

## 2019-07-05 ENCOUNTER — Encounter: Payer: BC Managed Care – PPO | Admitting: Physical Therapy

## 2019-07-08 ENCOUNTER — Encounter: Payer: BC Managed Care – PPO | Admitting: Physical Therapy

## 2019-07-12 ENCOUNTER — Encounter: Payer: BC Managed Care – PPO | Admitting: Physical Therapy

## 2019-07-15 ENCOUNTER — Encounter: Payer: BC Managed Care – PPO | Admitting: Physical Therapy

## 2019-07-19 ENCOUNTER — Encounter: Payer: BC Managed Care – PPO | Admitting: Physical Therapy

## 2019-07-22 ENCOUNTER — Encounter: Payer: BC Managed Care – PPO | Admitting: Physical Therapy

## 2019-10-06 ENCOUNTER — Ambulatory Visit: Payer: BC Managed Care – PPO | Admitting: Orthopaedic Surgery

## 2019-11-09 ENCOUNTER — Other Ambulatory Visit: Payer: Self-pay | Admitting: Family Medicine

## 2019-11-09 DIAGNOSIS — Z1231 Encounter for screening mammogram for malignant neoplasm of breast: Secondary | ICD-10-CM

## 2019-11-26 ENCOUNTER — Telehealth: Payer: Self-pay | Admitting: Orthopaedic Surgery

## 2019-11-26 NOTE — Telephone Encounter (Signed)
Patient called.   Said she was seen in there emergency room last night and would like a call back from [facility] to discuss what her next course of action should be  Call back: [phone]

## 2019-11-26 NOTE — Telephone Encounter (Signed)
Error, wrong patient
